# Patient Record
Sex: Female | Born: 1975 | Race: White | Hispanic: No | Marital: Married | Smoking: Never smoker
Health system: Southern US, Community
[De-identification: ages and names within clinical notes are randomized; demographics above are authoritative.]

## PROBLEM LIST (undated history)

## (undated) DIAGNOSIS — A048 Other specified bacterial intestinal infections: Secondary | ICD-10-CM

## (undated) DIAGNOSIS — F419 Anxiety disorder, unspecified: Secondary | ICD-10-CM

## (undated) DIAGNOSIS — F329 Major depressive disorder, single episode, unspecified: Secondary | ICD-10-CM

## (undated) DIAGNOSIS — L509 Urticaria, unspecified: Secondary | ICD-10-CM

## (undated) DIAGNOSIS — K760 Fatty (change of) liver, not elsewhere classified: Secondary | ICD-10-CM

## (undated) DIAGNOSIS — K219 Gastro-esophageal reflux disease without esophagitis: Secondary | ICD-10-CM

## (undated) DIAGNOSIS — R319 Hematuria, unspecified: Secondary | ICD-10-CM

## (undated) DIAGNOSIS — T783XXA Angioneurotic edema, initial encounter: Secondary | ICD-10-CM

## (undated) DIAGNOSIS — L309 Dermatitis, unspecified: Secondary | ICD-10-CM

## (undated) HISTORY — DX: Urticaria, unspecified: L50.9

## (undated) HISTORY — DX: Hematuria, unspecified: R31.9

## (undated) HISTORY — PX: UPPER GASTROINTESTINAL ENDOSCOPY: SHX188

## (undated) HISTORY — DX: Angioneurotic edema, initial encounter: T78.3XXA

## (undated) HISTORY — DX: Fatty (change of) liver, not elsewhere classified: K76.0

## (undated) HISTORY — DX: Dermatitis, unspecified: L30.9

## (undated) HISTORY — PX: CHOLECYSTECTOMY: SHX55

## (undated) HISTORY — DX: Other specified bacterial intestinal infections: A04.8

## (undated) HISTORY — DX: Anxiety disorder, unspecified: F41.9

## (undated) HISTORY — DX: Gastro-esophageal reflux disease without esophagitis: K21.9

## (undated) HISTORY — PX: OTHER SURGICAL HISTORY: SHX169

## (undated) HISTORY — DX: Major depressive disorder, single episode, unspecified: F32.9

---

## 1999-06-04 ENCOUNTER — Other Ambulatory Visit: Admission: RE | Admit: 1999-06-04 | Discharge: 1999-06-04 | Payer: Self-pay | Admitting: Family Medicine

## 2000-09-21 ENCOUNTER — Other Ambulatory Visit: Admission: RE | Admit: 2000-09-21 | Discharge: 2000-09-21 | Payer: Self-pay | Admitting: Family Medicine

## 2001-11-19 ENCOUNTER — Encounter: Payer: Self-pay | Admitting: Obstetrics and Gynecology

## 2001-11-19 ENCOUNTER — Ambulatory Visit (HOSPITAL_COMMUNITY): Admission: RE | Admit: 2001-11-19 | Discharge: 2001-11-19 | Payer: Self-pay | Admitting: Obstetrics and Gynecology

## 2005-02-23 ENCOUNTER — Emergency Department (HOSPITAL_COMMUNITY): Admission: AD | Admit: 2005-02-23 | Discharge: 2005-02-23 | Payer: Self-pay | Admitting: Emergency Medicine

## 2005-06-22 ENCOUNTER — Emergency Department (HOSPITAL_COMMUNITY): Admission: EM | Admit: 2005-06-22 | Discharge: 2005-06-22 | Payer: Self-pay | Admitting: Family Medicine

## 2005-06-22 ENCOUNTER — Ambulatory Visit (HOSPITAL_COMMUNITY): Admission: RE | Admit: 2005-06-22 | Discharge: 2005-06-22 | Payer: Self-pay | Admitting: Family Medicine

## 2008-03-25 ENCOUNTER — Emergency Department (HOSPITAL_BASED_OUTPATIENT_CLINIC_OR_DEPARTMENT_OTHER): Admission: EM | Admit: 2008-03-25 | Discharge: 2008-03-26 | Payer: Self-pay | Admitting: Emergency Medicine

## 2008-03-25 ENCOUNTER — Ambulatory Visit: Payer: Self-pay | Admitting: Diagnostic Radiology

## 2009-09-24 ENCOUNTER — Ambulatory Visit (HOSPITAL_COMMUNITY): Admission: RE | Admit: 2009-09-24 | Discharge: 2009-09-24 | Payer: Self-pay | Admitting: Family Medicine

## 2009-09-24 ENCOUNTER — Encounter (INDEPENDENT_AMBULATORY_CARE_PROVIDER_SITE_OTHER): Payer: Self-pay | Admitting: *Deleted

## 2009-09-27 ENCOUNTER — Encounter (INDEPENDENT_AMBULATORY_CARE_PROVIDER_SITE_OTHER): Payer: Self-pay | Admitting: *Deleted

## 2009-10-10 ENCOUNTER — Encounter: Payer: Self-pay | Admitting: Physician Assistant

## 2009-10-10 ENCOUNTER — Ambulatory Visit: Payer: Self-pay | Admitting: Internal Medicine

## 2009-10-10 DIAGNOSIS — R112 Nausea with vomiting, unspecified: Secondary | ICD-10-CM | POA: Insufficient documentation

## 2009-10-10 DIAGNOSIS — R1012 Left upper quadrant pain: Secondary | ICD-10-CM | POA: Insufficient documentation

## 2009-10-10 DIAGNOSIS — K219 Gastro-esophageal reflux disease without esophagitis: Secondary | ICD-10-CM | POA: Insufficient documentation

## 2009-10-10 DIAGNOSIS — R110 Nausea: Secondary | ICD-10-CM | POA: Insufficient documentation

## 2009-10-18 ENCOUNTER — Telehealth: Payer: Self-pay | Admitting: Physician Assistant

## 2009-10-19 ENCOUNTER — Ambulatory Visit: Payer: Self-pay | Admitting: Internal Medicine

## 2009-12-07 ENCOUNTER — Telehealth: Payer: Self-pay | Admitting: Internal Medicine

## 2010-03-19 NOTE — Progress Notes (Signed)
Summary: Questions about meds.  Phone Note Call from Patient Call back at Work Phone [phone]   Caller: Patient Call For: Mike Gip Reason for Call: Talk to Nurse Summary of Call: pt. has some questions about the Aciphex that was prescribed Initial call taken by: Karna Christmas,  October 18, 2009 11:40 AM  Follow-up for Phone Call        The pt started taking Aciphex on 10-10-09 and since Sat 10-13-09 the pt has had diarrhea and a headache.   Follow-up by: Joselyn Glassman,  October 18, 2009 1:44 PM  Additional Follow-up for Phone Call Additional follow up Details #1::        Per Amy Estserwood PA, take the Aciphex once daily 30 min prior to breakfast and call us Tues and let us know if you are still having watery loose stools.  She said she will call Tues. Additional Follow-up by: Joselyn Glassman,  October 18, 2009 1:54 PM

## 2010-03-19 NOTE — Letter (Signed)
Summary: EGD Instructions  [facility]  [address] [place], [place] [address]   Phone: [phone]  Fax: [phone]       Laura Hansen    1975/11/01    MRN: [id number]       Procedure Day /Date:10-26-09     Arrival Time:  7:30 AM     Procedure Time: 8:30 AM     Location of Procedure:                     X     [facility] ( Outpatient Registration)  PREPARATION FOR ENDOSCOPY   On 10-26-09 THE DAY OF THE PROCEDURE:  1.   No solid foods, milk or milk products are allowed after midnight the night before your procedure.  2.   Do not drink anything colored red or purple.  Avoid juices with pulp.  No orange juice.  3.  You may drink clear liquids until 4:30 AM , which is 4 hours before your procedure.                                                                                                CLEAR LIQUIDS INCLUDE: Water Jello Ice Popsicles Tea (sugar ok, no milk/cream) Powdered fruit flavored drinks Coffee (sugar ok, no milk/cream) Gatorade Juice: apple, white grape, white cranberry  Lemonade Clear bullion, consomm, broth Carbonated beverages (any kind) Strained chicken noodle soup Hard Candy   MEDICATION INSTRUCTIONS  Unless otherwise instructed, you should take regular prescription medications with a small sip of water as early as possible the morning of your procedure.        OTHER INSTRUCTIONS  You will need a responsible adult at least 35 years of age to accompany you and drive you home.   This person must remain in the waiting room during your procedure.  Wear loose fitting clothing that is easily removed.  Leave jewelry and other valuables at home.  However, you may wish to bring a book to read or an iPod/MP3 player to listen to music as you wait for your procedure to start.  Remove all body piercing jewelry and leave at home.  Total time from sign-in until discharge is approximately 2-3 hours.  You should go home directly  after your procedure and rest.  You can resume normal activities the day after your procedure.  The day of your procedure you should not:   Drive   Make legal decisions   Operate machinery   Drink alcohol   Return to work  You will receive specific instructions about eating, activities and medications before you leave.    The above instructions have been reviewed and explained to me by   _______________________    I fully understand and can verbalize these instructions _____________________________ Date _________

## 2010-03-19 NOTE — Procedures (Signed)
Summary: Instructions for procedure/[place]  Instructions for procedure/[place]   Imported By: Sherian Rein 10/12/2009 11:01:41  _____________________________________________________________________  External Attachment:    Type:   Image     Comment:   External Document

## 2010-03-19 NOTE — Assessment & Plan Note (Signed)
Summary: LUQ pain, small knot, referred by Dr. Suzanne Boron   History of Present Illness Visit Type: Initial Consult Primary GI MD: Stan Head MD [facility] Primary Provider: Marcelle Overlie, MD Requesting Provider: Marcelle Overlie, MD Chief Complaint: LUQ pain with knot  History of Present Illness:   PLEASANT 34 YO FEMALE NEW TO GI TODAY,REFERRED BY DR. Vincente Poli WITH C/O LUQ PAIN AND NAUSEA. PT ALSO SEEN BY [facility]. SHE HAS BEEN TREATED FOR GERD IN THE PAST WITH NEXIUM,BUT NOT ON CURRENTLY DUE TO COST.  SHE RELATES PROBLEMS WITH INTERMITTENT LUQ PAIN OVER THE PAST FEW YEARS,SHE HAD AN EPISODE A COUPLE WEEKS AGO LASTING FOR 3 DAYS AND DOUBLES HER OVER A TIMES. THIS OCCURED AFTER SHE HAD DONE A LOT OF LIFTING FOR A FESTIVAL,NO KNOWN INJURY. HER PAIN IS SHARP, NONRADIATING,WITH A KNOT LIKE FEELING INTHE LUQ.NO TRIGGERS THAT SHE IS AWARE OF, AND NO PATTERN RELATING TO FOOD INTAKE, EMPTY STOMACH,BM'S,POSITION ETC. SHE ALSO HAS FAIRLY CHRONIC NAUSEA,WORSE WHEN PAIN IS WORSE BUT PRESENT LOW LEVEL MOST DAYS. VOMITS SPORADICALLY. APPETITE FINE,WEIGHT STABLE.. + HEARTBURN, NO DYSPHAGIA. NO REGULAR ASA,NSAIDS.  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 RECENT LABS UNREMARKABLE,INCLUDING PREG.TEST. ABDOMINAL US NEGATIVE.   GI Review of Systems    Reports abdominal pain, acid reflux, and  nausea.     Location of  Abdominal pain: LUQ.    Denies belching, bloating, chest pain, dysphagia with liquids, dysphagia with solids, heartburn, loss of appetite, vomiting, vomiting blood, weight loss, and  weight gain.        Denies anal fissure, black tarry stools, change in bowel habit, constipation, diarrhea, diverticulosis, fecal incontinence, heme positive stool, hemorrhoids, irritable bowel syndrome, jaundice, light color stool, liver problems, rectal bleeding, and  rectal pain. Preventive Screening-Counseling & Management  Alcohol-Tobacco     Smoking  Status: never      Drug Use:  no.      Current Medications (verified): 1)  Kariva 0.15-0.02/0.01 Mg (21/5) Tabs (Desogestrel-Ethinyl Estradiol) .Marland Kitchen.. 1 By Mouth Once Daily 2)  Tenuate (Weight Loss) .Marland Kitchen.. 1 By Mouth Once Daily  Allergies (verified): 1)  ! Codeine 2)  ! Amoxicillin  Past History:  Past Medical History: GERD  Past Surgical History: Unremarkable  Family History: Reviewed history and no changes required. No FH of Colon Cancer:  Social History: Reviewed history and no changes required. 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  Alcohol Use - yes Illicit Drug Use - no Daily Caffeine Use Smoking Status:  never Drug Use:  no  Review of Systems       The patient complains of allergy/sinus and sleeping problems.  The patient denies anemia, anxiety-new, arthritis/joint pain, back pain, blood in urine, breast changes/lumps, change in vision, confusion, cough, coughing up blood, depression-new, fainting, fatigue, fever, headaches-new, hearing problems, heart murmur, heart rhythm changes, itching, menstrual pain, muscle pains/cramps, night sweats, nosebleeds, pregnancy symptoms, shortness of breath, skin rash, sore throat, swelling of feet/legs, swollen lymph glands, thirst - excessive , urination - excessive , urination changes/pain, urine leakage, vision changes, and voice change.         SEE HPI  Vital Signs:  Patient profile:   35 year old female Height:      69 inches Weight:      249.38 pounds BMI:     36.96 Pulse rate:   84 / minute Pulse rhythm:   regular BP sitting:  128 / 82  (left arm)  Vitals Entered By: Milford Cage NCMA (October 10, 2009 2:11 PM)  Physical Exam  General:  Well developed, well nourished, no acute distress. Head:  Normocephalic and atraumatic. Eyes:  PERRLA, no icterus. Lungs:  Clear throughout to auscultation. Heart:  Regular rate and rhythm; no murmurs, rubs,  or bruits. Abdomen:  OBESE, SOFT, SHE IS  TENDER ALONG THE LEFT COSTAL MARGIN FROM MID TO LATERAL LOWER RIBS-PRESSURE REPRODUCES HER PAIN,ALSO MILD EPIGASTRIC TENDERNESS, NO MASS OR HSM,BS+ Rectal:  NOT DONE Neurologic:  Alert and  oriented x4;  grossly normal neurologically. Psych:  Alert and cooperative. Normal mood and affect.   Impression & Recommendations:  Problem # 1:  ABDOMINAL PAIN, LEFT UPPER QUADRANT (ICD-789.02) Assessment Unchanged 34 YO FEMALE WITH INTERMITTENT LUQ PAIN OVER THE PAST COUPLE YEARS,GENERALLY LASTING FOR DAYS. HER EXAM IS CONSISTENT WITH MUSCULOSKELETAL ETIOLOGY/COSTOCHONDRITIS. SUSPECT AGGRAVATED BY REPETITIVE LIFTING.   REASSURANCE,LOCAL HEAT,AVOID LIFTING  OVER THE NEXT MONTH,DISCUSSED NSAIDS BUT SHE HAS NOT FELT THESE TO BE SUCCESSFUL IN THE PAST. NOT FOR WORK TO RESRICT LIFTING OVER 15 POUNDS OVER THE NEXT MONTH. Orders: ZEGD (ZEGD)  Problem # 2:  NAUSEA AND VOMITING (ICD-787.01) Assessment: Unchanged FAIRLY CHRONIC NAUSEA,OCCASIONAL VOMITING. ETIOLOGY NOT CLEAR,MAY BE REFLUX RELATED,NONULCER DYSPEPSIA,H.PYLORI GASTRITIS.   TRIAL OF ACIPHEX 20 MG TWICE DAILY X ONE MONTH THEN DAILY IN AM BEFORE EATING SCHEDULE FOR EGD WITH DR. Leone Payor. PROCEDURE,RISKS,ALTERNATIVES DISCUSSED IN DETAIL WITH THE PT.  Problem # 3:  GERD (ICD-530.81) Assessment: Comment Only SEE ABOVE Orders: ZEGD (ZEGD)  Patient Instructions: 1)  We sent a prescription  for Aciphex to [facility]. 2)  We have given you samples to take 1 tab 30 min prior to breakfast and 30 min prior to dinner.   3)  We scheduled the Endoscopy with Dr. Leone Payor at [facility]. Go to admitting 1st floor. 4)  Directions and brochure provided. 5)  We have given you a letter to give to your employer to advise them you should not lift over 15 pounds for the next month. 6)  Copy sent to : Clarisa Schools, MD 7)                         Dr. Rudi Heap 8)  The medication list was reviewed and reconciled.  All changed / newly prescribed  medications were explained.  A complete medication list was provided to the patient / caregiver. Prescriptions: ACIPHEX 20 MG TBEC (RABEPRAZOLE SODIUM) Take 1 tab 30 min before breakfast  #30 x 3   Entered by:   Lowry Ram NCMA   Authorized by:   Sammuel Cooper PA-c   Signed by:   Lowry Ram NCMA on 10/10/2009   Method used:   Electronically to        [facility]  [address] (retail)       [address]       [place], [place]  [address]       Ph: [phone]       Fax: [phone]   RxID:   [id number]

## 2010-03-19 NOTE — Miscellaneous (Signed)
Summary: Ranitidine and naproxen Rx  Clinical Lists Changes  Medications: Removed medication of ACIPHEX 20 MG TBEC (RABEPRAZOLE SODIUM) Take 1 tab 30 min before breakfast Removed medication of NEXIUM 40 MG CPDR (ESOMEPRAZOLE MAGNESIUM) Added new medication of RANITIDINE HCL 300 MG  TABS (RANITIDINE HCL) 1 by mouth two times a day - Signed Added new medication of NAPROXEN 500 MG TABS (NAPROXEN) 1 by mouth two times a day x 1 month - Signed Rx of RANITIDINE HCL 300 MG  TABS (RANITIDINE HCL) 1 by mouth two times a day;  #60 x 2;  Signed;  Entered by: Iva Boop MD, Clementeen Graham;  Authorized by: Iva Boop MD, [facility];  Method used: Electronically to [facility] [address], [place], [place], [place]  [address], Ph: [phone], Fax: [phone] Rx of NAPROXEN 500 MG TABS (NAPROXEN) 1 by mouth two times a day x 1 month;  #60 x 0;  Signed;  Entered by: Iva Boop MD, Clementeen Graham;  Authorized by: Iva Boop MD, [facility];  Method used: Electronically to [facility] [address], [place], [place], [place]  [address], Ph: [phone], Fax: [phone]    Prescriptions: NAPROXEN 500 MG TABS (NAPROXEN) 1 by mouth two times a day x 1 month  #60 x 0   Entered and Authorized by:   Iva Boop MD, [facility]   Signed by:   Iva Boop MD, FACG on 10/19/2009   Method used:   Electronically to        [facility]  [address] [address] (retail)       [address]       [place], [place]  [address]       Ph: [phone]       Fax: [phone]   RxID:   [id number] RANITIDINE HCL 300 MG  TABS (RANITIDINE HCL) 1 by mouth two times a day  #60 x 2   Entered and Authorized by:   Iva Boop MD, [facility]   Signed by:   Iva Boop MD, FACG on 10/19/2009   Method used:   Electronically to        [facility]  [address] (retail)       [address]       [place], [place]  [address]       Ph: [phone]       Fax: [phone]   RxID:   [id number]

## 2010-03-19 NOTE — Procedures (Signed)
Summary: Upper Endoscopy  Patient: Laura Hansen Note: All result statuses are Final unless otherwise noted.  Tests: (1) Upper Endoscopy (EGD)   EGD Upper Endoscopy       DONE     [facility]     [address]     [place], [place]  [address]           ENDOSCOPY PROCEDURE REPORT           PATIENT:  Laura, Hansen  MR#:  [id number]     BIRTHDATE:  [birth year], 34 yrs. old  GENDER:  female           ENDOSCOPIST:  Iva Boop, MD, [facility]           PROCEDURE DATE:  10/19/2009     PROCEDURE:  EGD, diagnostic     ASA CLASS:  Class I     INDICATIONS:  abdominal pain, left upper quadrant           MEDICATIONS:   Fentanyl 50 mcg IV, Versed 5 mg IV     TOPICAL ANESTHETIC:  Exactacain Spray           DESCRIPTION OF PROCEDURE:   After the risks benefits and     alternatives of the procedure were thoroughly explained, informed     consent was obtained.  The LB [id number] endoscope was     introduced through the mouth and advanced to the second portion of     the duodenum, without limitations.  The instrument was slowly     withdrawn as the mucosa was fully examined.     <<PROCEDUREIMAGES>>           A hiatal hernia was found. It was 2 cm in size.  Otherwise the     examination was normal.    Retroflexed views revealed a hiatal     hernia.    The scope was then withdrawn from the patient and the     procedure completed.           COMPLICATIONS:  None           ENDOSCOPIC IMPRESSION:     1) 2 cm hiatal hernia     2) Otherwise normal examination           3) Problems seem to be a combination of musculoskeletal pain and     some heartburn     RECOMMENDATIONS:     1) She appropriately stopped Aciphex as it caused diarrhea.     2) Ranitidine 300 mg 2 times a day (Rx sent)     3) Naproxen 500 mg 2 times a day for 1 month (Rx sent)     4)Heat to left lower ribs and left upper abdomen wall as needed                 REPEAT EXAM:  In for as needed.           Iva Boop, MD, Clementeen Graham           CC:  Marcelle Overlie, MD     The Patient           n.     eSIGNED:   Iva Boop at 10/19/2009 11:06 AM           Guadalupe Dawn, [id number]  Note: An exclamation mark (!) indicates a result that was not dispersed into the flowsheet. Document Creation Date: 10/19/2009 11:07 AM _______________________________________________________________________  (  1) Order result status: Final Collection or observation date-time: 10/19/2009 10:51 Requested date-time:  Receipt date-time:  Reported date-time:  Referring Physician:   Ordering Physician: Stan Head [phone]) Specimen Source:  Source: Launa Grill Order Number: [id number] Lab site:   Appended Document: Upper Endoscopy    Clinical Lists Changes

## 2010-03-19 NOTE — Progress Notes (Signed)
Summary: scar tissue  Phone Note Call from Patient Call back at Home Phone [phone]   Caller: Patient Call For: Dr. Leone Payor Reason for Call: Talk to Nurse Summary of Call: pt has "aggrevated" scar tissue on her abd again and want to know what she needs to do Initial call taken by: Vallarie Mare,  December 07, 2009 10:30 AM  Follow-up for Phone Call        Patient  advised to try heating pack to left side and naproxen as ordered by Dr Leone Payor at the time of her endoscopy. Patient reports she was recently diagnosed with bronchitis and coughing has flared her  left sided pain again.    I have asked her to follow up with her primary care for continued muscle stain and left sided pain.   Follow-up by: Darcey Nora RN, CGRN,  December 07, 2009 10:56 AM  Additional Follow-up for Phone Call Additional follow up Details #1::        agree Additional Follow-up by: Iva Boop MD, Clementeen Graham,  December 07, 2009 4:18 PM

## 2010-03-19 NOTE — Letter (Signed)
Summary: Out of Work  [facility]  [address] [place], [place] [address]   Phone: [phone]  Fax: [phone]    October 10, 2009   Employee:  Alcide Evener    To Whom It May Concern:   For Medical reasons, we do not want the above named patient to lift over 15 pounds for the next month.    If you need additional information, please feel free to contact our office.         Sincerely,

## 2010-03-19 NOTE — Letter (Signed)
Summary: New Patient letter  [facility]  [address] [place], [place] [address]   Phone: [phone]  Fax: [phone]       09/27/2009 MRN: [id number]  Laura Hansen [address] [place], [place]  [address]  Dear Laura Hansen,  Welcome to the Gastroenterology Division at [facility].    You are scheduled to see Dr.  Christella Hartigan on 11-07-09 at 10:30a.m. on the 3rd floor at [facility], [address]  We ask that you try to arrive at our office 15 minutes prior to your appointment time to allow for check-in.  We would like you to complete the enclosed self-administered evaluation form prior to your visit and bring it with you on the day of your appointment.  We will review it with you.  Also, please bring a complete list of all your medications or, if you prefer, bring the medication bottles and we will list them.  Please bring your insurance card so that we may make a copy of it.  If your insurance requires a referral to see a specialist, please bring your referral form from your primary care physician.  Co-payments are due at the time of your visit and may be paid by cash, check or credit card.     Your office visit will consist of a consult with your physician (includes a physical exam), any laboratory testing he/she may order, scheduling of any necessary diagnostic testing (e.g. x-ray, ultrasound, CT-scan), and scheduling of a procedure (e.g. Endoscopy, Colonoscopy) if required.  Please allow enough time on your schedule to allow for any/all of these possibilities.    If you cannot keep your appointment, please call [phone] to cancel or reschedule prior to your appointment date.  This allows [place] the opportunity to schedule an appointment for another patient in need of care.  If you do not cancel or reschedule by 5 p.m. the business day prior to your appointment date, you will be charged a $50.00 late cancellation/no-show fee.    Thank you for choosing [facility] for your medical needs.  We appreciate the opportunity to care for you.  Please visit [place] at our website  to learn more about our practice.                     Sincerely,                                                             The Gastroenterology Division

## 2010-06-04 LAB — CBC
Hemoglobin: 13.5 g/dL (ref 12.0–15.0)
MCHC: 34.4 g/dL (ref 30.0–36.0)
Platelets: 246 10*3/uL (ref 150–400)
RBC: 4.42 MIL/uL (ref 3.87–5.11)
RDW: 12.8 % (ref 11.5–15.5)
WBC: 16.5 10*3/uL — ABNORMAL HIGH (ref 4.0–10.5)

## 2010-06-04 LAB — BASIC METABOLIC PANEL
BUN: 17 mg/dL (ref 6–23)
Calcium: 9.1 mg/dL (ref 8.4–10.5)
GFR calc non Af Amer: >60 mL/min (ref >60)
Sodium: 139 mEq/L (ref 135–145)

## 2010-06-04 LAB — DIFFERENTIAL
Basophils Absolute: 0 10*3/uL (ref 0.0–0.1)
Basophils Relative: 0 % (ref 0–1)
Eosinophils Absolute: 0.6 10*3/uL (ref 0.0–0.7)
Eosinophils Relative: 4 % (ref 0–5)
Neutro Abs: 11.8 10*3/uL — ABNORMAL HIGH (ref 1.7–7.7)
Neutrophils Relative %: 71 % (ref 43–77)

## 2011-07-18 ENCOUNTER — Ambulatory Visit (HOSPITAL_COMMUNITY)
Admission: RE | Admit: 2011-07-18 | Discharge: 2011-07-18 | Disposition: A | Discharge status: Home / Self Care | Payer: BC Managed Care – PPO | Source: Ambulatory Visit | Attending: Family Medicine | Admitting: Family Medicine

## 2011-07-18 ENCOUNTER — Other Ambulatory Visit: Payer: Self-pay | Admitting: Family Medicine

## 2011-07-18 DIAGNOSIS — R1011 Right upper quadrant pain: Secondary | ICD-10-CM

## 2011-07-18 DIAGNOSIS — K769 Liver disease, unspecified: Secondary | ICD-10-CM | POA: Insufficient documentation

## 2011-07-25 ENCOUNTER — Encounter: Payer: Self-pay | Admitting: Gastroenterology

## 2011-08-01 ENCOUNTER — Encounter: Payer: Self-pay | Admitting: *Deleted

## 2011-08-08 ENCOUNTER — Ambulatory Visit: Payer: BC Managed Care – PPO | Admitting: Gastroenterology

## 2011-08-12 ENCOUNTER — Encounter: Payer: Self-pay | Admitting: Physician Assistant

## 2011-08-12 ENCOUNTER — Ambulatory Visit (INDEPENDENT_AMBULATORY_CARE_PROVIDER_SITE_OTHER): Payer: BC Managed Care – PPO | Admitting: Physician Assistant

## 2011-08-12 ENCOUNTER — Other Ambulatory Visit (INDEPENDENT_AMBULATORY_CARE_PROVIDER_SITE_OTHER): Payer: BC Managed Care – PPO

## 2011-08-12 VITALS — BP 118/74 | HR 68 | Ht 69.0 in | Wt 272.0 lb

## 2011-08-12 DIAGNOSIS — R110 Nausea: Secondary | ICD-10-CM

## 2011-08-12 DIAGNOSIS — R197 Diarrhea, unspecified: Secondary | ICD-10-CM

## 2011-08-12 DIAGNOSIS — R109 Unspecified abdominal pain: Secondary | ICD-10-CM

## 2011-08-12 LAB — CBC WITH DIFFERENTIAL/PLATELET
MCHC: 33.5 g/dL (ref 30.0–36.0)
MCV: 90.5 fl (ref 78.0–100.0)
Neutro Abs: 9.3 10*3/uL — ABNORMAL HIGH (ref 1.4–7.7)
Neutrophils Relative %: 70.5 % (ref 43.0–77.0)
RBC: 4.09 Mil/uL (ref 3.87–5.11)
RDW: 13 % (ref 11.5–14.6)
WBC: 13.2 10*3/uL — ABNORMAL HIGH (ref 4.5–10.5)

## 2011-08-12 MED ORDER — GLYCOPYRROLATE 2 MG PO TABS: 2.0000 mg | ORAL_TABLET | Freq: Two times a day (BID) | ORAL | Status: DC | PRN

## 2011-08-12 MED ORDER — METRONIDAZOLE 250 MG PO TABS: 250.0000 mg | ORAL_TABLET | Freq: Four times a day (QID) | ORAL | Status: AC

## 2011-08-12 NOTE — Progress Notes (Signed)
Subjective:    Patient ID: Laura Hansen, female    DOB: Nov 29, 1975, 36 y.o.   MRN: [id number]  HPI Laura Hansen is a 36 year old white female known to Dr. Leone Payor who had undergone upper endoscopy in 2011 for complaints of abdominal pain and left upper quadrant pain, she was found to have a small hiatal hernia. She is not on any regular PPI therapy and says she doesn't usually have heartburn symptoms.  She comes in today with new complaint of diarrhea, with initial onset about 6 weeks ago. At that time she says there was a stomach bug going around in her workplace. She developed abrupt onset of watery diarrhea associated with bad crampy abdominal pain and was having multiple of bowel movements per day. She says she did have some low-grade fevers associated at that time and nausea. Those symptoms lasted about 2 weeks She was then seen by her primary care provider and had lab work done and stool cultures including a stool for C. difficile toxin all of which were negative. She was not treated with any medications at that time. She says since over the past 3-4 weeks she is continued with loose stools and is generally having 4-5 loose bowel movements per day still associated with urgency and cramping. She says primarily she feels pain in her left upper quadrant which can be sharp at times sometimes associated with sweats after eating and then followed by diarrhea. Not noted any melena or hematochezia. She says she usually gets the left upper quadrant abdominal pain within 15-20 minutes of eating area she has had some ongoing nausea, no vomiting, no fever or chills. Her appetite has been okay but she does seem to have some early satiety symptoms. She complains of feeling bloated ,gassy and has  "gurgling" in her abdomen. She haslost 10-12 pounds in onset of her illness and has stabilized since  She did take a course of antibiotics in either March or April for sinus infection, she has not been on any new  medications or supplements or had any known infectious exposures. She does have well water at home. No family members have been ill F amily history is negative for celiac disease and inflammatory bowel disease.    Review of Systems  Constitutional: Positive for appetite change and unexpected weight change.  HENT: Negative.   Eyes: Negative.   Respiratory: Negative.   Cardiovascular: Negative.   Gastrointestinal: Positive for nausea, abdominal pain and diarrhea.  Genitourinary: Negative.   Musculoskeletal: Negative.   Neurological: Negative.   Hematological: Negative.   Psychiatric/Behavioral: Negative.    Outpatient Encounter Prescriptions as of 08/12/2011  Medication Sig Dispense Refill  . cholecalciferol (VITAMIN D) 1000 UNITS tablet Take 1,000 Units by mouth daily.      Marland Kitchen desogestrel-ethinyl estradiol (KARIVA) 0.15-0.02/0.01 MG (21/5) tablet Take 1 tablet by mouth daily.      Marland Kitchen glycopyrrolate (ROBINUL) 2 MG tablet Take 1 tablet (2 mg total) by mouth 2 (two) times daily as needed.  180 tablet  1  . metroNIDAZOLE (FLAGYL) 250 MG tablet Take 1 tablet (250 mg total) by mouth 4 (four) times daily.  30 tablet  0        Allergies  Allergen Reactions  . Amoxicillin   . Codeine    Patient Active Problem List  Diagnosis  . GERD  . NAUSEA AND VOMITING  . ABDOMINAL PAIN, LEFT UPPER QUADRANT   History   Social History  . Marital Status: Married    Spouse Name:  N/A    Number of Children: N/A  . Years of Education: N/A   Occupational History  . Not on file.   Social History Main Topics  . Smoking status: Never Smoker   . Smokeless tobacco: Not on file  . Alcohol Use: Yes     SOCIAL   . Drug Use: No  . Sexually Active: Not on file   Other Topics Concern  . Not on file   Social History Narrative  . No narrative on file    Objective:   Physical Exam well-developed white female in no acute distress, pleasant blood pressure 118/74 pulse 68 height 5 foot 9 weight 272.  HEENT; nontraumatic normocephalic EOMI PERRLA sclera anicteric, Neck; supple no JVD, Cardiovascular; regular rate and rhythm with S1-S2 no murmur or gallop, Pulmonary; clear bilaterally, Abdomen; soft, tender left upper quadrant and left mid quadrant there is no guarding, no rebound, no palpable mass or hepatosplenomegaly, Rectal; not done, Extremities; no clubbing, cyanosis, or edema skin warm and dry, Psych; mood and affect normal and appropriate.        Assessment & Plan:  #93 36 year old female with a 6 week history of illness with diarrhea and abdominal cramping initially with multiple bowel movements per day of a watery nonbloody stool, now with persistent abdominal cramping, left upper quadrant discomfort and 4-5 loose stools per day as well as persistent nausea. 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 Cannot rule out Giardia or C. Diff, also consider IBD though doubt.  Plan; CBC with differential and CRP Stool for lactoferrin and stool C. difficile PCR Start empiric Flagyl 250 mg by mouth 4 times daily x10 days Start Robinul Forte 2 mg 1 by mouth twice daily as needed for cramping and urgency Patient is very concerned ; she says she has had similar episodes in the past- last about a year ago and is worried about underlying colitis etc. given prolonged current course. Will schedule for colonoscopy with Dr. Leone Payor. Procedure discussed in detail with the patient and she is agreeable to proceed, will give her the empiric course of Flagyl and Robinul Forte, and schedule procedures 2-3 weeks from now.  She has been taking Culturelle  at  home and will continue

## 2011-08-12 NOTE — Patient Instructions (Addendum)
Please go to the basement upon leaving today to have your labs done. You have been scheduled for a Colonoscopy with separate instructions given. Your prep kit has been sent to your pharmacy for you to pick up. Your prescription(s) has(have) been sent to your pharmacy for you to pick up. Please start on a bland low roughage diet.

## 2011-08-13 ENCOUNTER — Encounter: Payer: Self-pay | Admitting: Internal Medicine

## 2011-08-13 ENCOUNTER — Other Ambulatory Visit: Payer: BC Managed Care – PPO

## 2011-08-13 DIAGNOSIS — R197 Diarrhea, unspecified: Secondary | ICD-10-CM

## 2011-08-13 DIAGNOSIS — R109 Unspecified abdominal pain: Secondary | ICD-10-CM

## 2011-08-13 DIAGNOSIS — R110 Nausea: Secondary | ICD-10-CM

## 2011-08-13 NOTE — Progress Notes (Signed)
I agree with excellent assessment and plan

## 2011-08-14 LAB — FECAL LACTOFERRIN, QUANT: Lactoferrin: NEGATIVE

## 2011-08-15 LAB — CLOSTRIDIUM DIFFICILE BY PCR: Toxigenic C. Difficile by PCR: NOT DETECTED

## 2011-08-19 ENCOUNTER — Other Ambulatory Visit: Payer: Self-pay | Admitting: *Deleted

## 2011-08-19 MED ORDER — MOVIPREP 100 G PO SOLR: 1.0000 | Freq: Once | ORAL | Status: DC

## 2011-08-29 ENCOUNTER — Encounter: Payer: Self-pay | Admitting: Internal Medicine

## 2011-08-29 ENCOUNTER — Ambulatory Visit (AMBULATORY_SURGERY_CENTER): Payer: BC Managed Care – PPO | Admitting: Internal Medicine

## 2011-08-29 VITALS — BP 122/41 | HR 80 | Temp 97.5°F | Resp 16 | Ht 69.0 in | Wt 272.0 lb

## 2011-08-29 DIAGNOSIS — D126 Benign neoplasm of colon, unspecified: Secondary | ICD-10-CM

## 2011-08-29 DIAGNOSIS — R197 Diarrhea, unspecified: Secondary | ICD-10-CM

## 2011-08-29 MED ORDER — SODIUM CHLORIDE 0.9 % IV SOLN: 500.0000 mL | INTRAVENOUS | Status: DC

## 2011-08-29 NOTE — Patient Instructions (Addendum)
There was one small polyp removed. It looks benign and I doubt it is related to your symptoms.  I suspect IBS or Irritable Bowel Syndrome but took biopsies to see if another problem is causing thins.  My office will call you with biopsy results and plans.  Thank you for choosing me and [facility].  Iva Boop, MD, FACG YOU HAD AN ENDOSCOPIC PROCEDURE TODAY AT THE [facility]: Refer to the procedure report that was given to you for any specific questions about what was found during the examination.  If the procedure report does not answer your questions, please call your gastroenterologist to clarify.  If you requested that your care partner not be given the details of your procedure findings, then the procedure report has been included in a sealed envelope for you to review at your convenience later.  YOU SHOULD EXPECT: Some feelings of bloating in the abdomen. Passage of more gas than usual.  Walking can help get rid of the air that was put into your GI tract during the procedure and reduce the bloating. If you had a lower endoscopy (such as a colonoscopy or flexible sigmoidoscopy) you may notice spotting of blood in your stool or on the toilet paper. If you underwent a bowel prep for your procedure, then you may not have a normal bowel movement for a few days.  DIET: Your first meal following the procedure should be a light meal and then it is ok to progress to your normal diet.  A half-sandwich or bowl of soup is an example of a good first meal.  Heavy or fried foods are harder to digest and may make you feel nauseous or bloated.  Likewise meals heavy in dairy and vegetables can cause extra gas to form and this can also increase the bloating.  Drink plenty of fluids but you should avoid alcoholic beverages for 24 hours.  ACTIVITY: Your care partner should take you home directly after the procedure.  You should plan to take it easy, moving slowly for the rest of the  day.  You can resume normal activity the day after the procedure however you should NOT DRIVE or use heavy machinery for 24 hours (because of the sedation medicines used during the test).    SYMPTOMS TO REPORT IMMEDIATELY: A gastroenterologist can be reached at any hour.  During normal business hours, 8:30 AM to 5:00 PM Monday through Friday, call [phone].  After hours and on weekends, please call the GI answering service at [phone] who will take a message and have the physician on call contact you.   Following lower endoscopy (colonoscopy or flexible sigmoidoscopy):  Excessive amounts of blood in the stool  Significant tenderness or worsening of abdominal pains  Swelling of the abdomen that is new, acute  Fever of 100F or higher  FOLLOW UP: If any biopsies were taken you will be contacted by phone or by letter within the next 1-3 weeks.  Call your gastroenterologist if you have not heard about the biopsies in 3 weeks.  Our staff will call the home number listed on your records the next business day following your procedure to check on you and address any questions or concerns that you may have at that time regarding the information given to you following your procedure. This is a courtesy call and so if there is no answer at the home number and we have not heard from you through the emergency physician on call, we  will assume that you have returned to your regular daily activities without incident.  SIGNATURES/CONFIDENTIALITY: You and/or your care partner have signed paperwork which will be entered into your electronic medical record.  These signatures attest to the fact that that the information above on your After Visit Summary has been reviewed and is understood.  Full responsibility of the confidentiality of this discharge information lies with you and/or your care-partner.

## 2011-08-29 NOTE — Progress Notes (Signed)
Patient did not have preoperative order for IV antibiotic SSI prophylaxis. (G8918)  Patient did not experience any of the following events: a burn prior to discharge; a fall within the facility; wrong site/side/patient/procedure/implant event; or a hospital transfer or hospital admission upon discharge from the facility. (G8907)  

## 2011-08-29 NOTE — Op Note (Signed)
[facility] [address] [place], [place]  [address]  COLONOSCOPY PROCEDURE REPORT  PATIENT:  Laura Hansen, Laura Hansen  MR#:  [id number] BIRTHDATE:  July 20, 1975, 36 yrs. old  GENDER:  female ENDOSCOPIST:  Iva Boop, MD, [facility]  PROCEDURE DATE:  08/29/2011 PROCEDURE:  Colonoscopy with biopsy and snare polypectomy ASA CLASS:  Class III INDICATIONS:  unexplained diarrhea MEDICATIONS:   These medications were titrated to patient response per physician's verbal order, MAC sedation, administered by CRNA, propofol (Diprivan) 450 mg IV  DESCRIPTION OF PROCEDURE:   After the risks benefits and alternatives of the procedure were thoroughly explained, informed consent was obtained.  Digital rectal exam was performed and revealed no abnormalities.   The LB [id number]-[id number] endoscope was introduced through the anus and advanced to the terminal ileum which was intubated for a short distance, without limitations. The quality of the prep was excellent, using MoviPrep.  The instrument was then slowly withdrawn as the colon was fully examined. <<PROCEDUREIMAGES>>  FINDINGS:  The terminal ileum appeared normal.  A diminutive polyp was found in the distal transverse colon. Polyp was snared without cautery. Retrieval was successful. snare polyp  This was otherwise a normal examination of the colon. Random biopsies were obtained and sent to pathology.   Retroflexed views in the rectum revealed no abnormalities.    The time to cecum = 2:19 minutes. The scope was then withdrawn in 10:33 minutes from the cecum and the procedure completed. COMPLICATIONS:  None ENDOSCOPIC IMPRESSION: 1) Normal terminal ileum 2) Diminutive polyp in the distal transverse colon 3) Otherwise normal examination - random colon biopsies taken RECOMMENDATIONS: 1) Await pathology results 2) will call with results and plans - suspect IBS  Iva Boop, MD, [facility]  CC:  The Patient and Rudi Heap,  MD  n. Rosalie Doctor:   Iva Boop at 08/29/2011 03:13 PM  Guadalupe Dawn, [phone]

## 2011-09-01 ENCOUNTER — Telehealth: Payer: Self-pay | Admitting: *Deleted

## 2011-09-01 NOTE — Telephone Encounter (Signed)
  Follow up Call-  Call back number 08/29/2011  Post procedure Call Back phone  # [phone]  Permission to leave phone message Yes     Patient questions:  Do you have a fever, pain , or abdominal swelling? no Pain Score  0 *  Have you tolerated food without any problems? yes  Have you been able to return to your normal activities? yes  Do you have any questions about your discharge instructions: Diet   no Medications  no Follow up visit  no  Do you have questions or concerns about your Care? no  Actions: * If pain score is 4 or above: No action needed, pain <4.

## 2011-09-05 NOTE — Progress Notes (Signed)
Quick Note:  Have her schedule a non-urgent follow-up appointment and keep the food diary   ______

## 2012-10-19 ENCOUNTER — Other Ambulatory Visit: Payer: Self-pay | Admitting: Obstetrics and Gynecology

## 2013-01-19 ENCOUNTER — Encounter: Payer: Self-pay | Admitting: Nurse Practitioner

## 2013-01-19 ENCOUNTER — Ambulatory Visit (INDEPENDENT_AMBULATORY_CARE_PROVIDER_SITE_OTHER): Payer: BC Managed Care – PPO | Admitting: Nurse Practitioner

## 2013-01-19 ENCOUNTER — Telehealth: Payer: Self-pay | Admitting: Nurse Practitioner

## 2013-01-19 ENCOUNTER — Encounter (INDEPENDENT_AMBULATORY_CARE_PROVIDER_SITE_OTHER): Payer: Self-pay

## 2013-01-19 VITALS — BP 129/84 | HR 76 | Temp 98.2°F | Ht 70.0 in | Wt 291.0 lb

## 2013-01-19 DIAGNOSIS — J209 Acute bronchitis, unspecified: Secondary | ICD-10-CM

## 2013-01-19 MED ORDER — CEFDINIR 300 MG PO CAPS: 300.0000 mg | ORAL_CAPSULE | Freq: Two times a day (BID) | ORAL | Status: DC

## 2013-01-19 MED ORDER — BENZONATATE 100 MG PO CAPS
100.0000 mg | ORAL_CAPSULE | Freq: Two times a day (BID) | ORAL | Status: DC | PRN
Start: 2013-01-19 — End: 2013-02-01

## 2013-01-19 MED ORDER — METHYLPREDNISOLONE ACETATE 80 MG/ML IJ SUSP
80.0000 mg | Freq: Once | INTRAMUSCULAR | Status: AC
  Administered 2013-01-19: 80 mg via INTRAMUSCULAR

## 2013-01-19 NOTE — Progress Notes (Signed)
   Subjective:    Patient ID: Laura Hansen, female    DOB: 1975-10-25, 37 y.o.   MRN: [id number]  HPI Patient in c/o tight productive cough keeping her up at night- low grade fever- OTC meds no help.    Review of Systems  Constitutional: Positive for fever. Negative for chills and appetite change.  HENT: Positive for congestion, rhinorrhea and sinus pressure.   Respiratory: Positive for cough (productive).   Cardiovascular: Negative.   Gastrointestinal: Negative.   Endocrine: Negative.   Genitourinary: Negative.   Musculoskeletal: Negative.   Neurological: Negative.   Hematological: Negative.   All other systems reviewed and are negative.       Objective:   Physical Exam  Constitutional: She is oriented to person, place, and time. She appears well-developed and well-nourished.  HENT:  Nose: Mucosal edema and rhinorrhea present. Right sinus exhibits no maxillary sinus tenderness and no frontal sinus tenderness. Left sinus exhibits no maxillary sinus tenderness and no frontal sinus tenderness.  Mouth/Throat: Uvula is midline, oropharynx is clear and moist and mucous membranes are normal.  Cardiovascular: Normal rate, regular rhythm and normal heart sounds.   Pulmonary/Chest: Effort normal and breath sounds normal. She has no wheezes. She has no rales.  Deep tight cough  Abdominal: Soft.  Neurological: She is alert and oriented to person, place, and time.  Skin: Skin is warm.     BP 129/84  Pulse 76  Temp(Src) 98.2 F (36.8 C) (Oral)  Ht 5\' 10"  (1.778 m)  Wt 291 lb (131.997 kg)  BMI 41.75 kg/m2      Assessment & Plan:   1. Bronchitis, acute    Meds ordered this encounter  Medications  . cefdinir (OMNICEF) 300 MG capsule    Sig: Take 1 capsule (300 mg total) by mouth 2 (two) times daily.    Dispense:  20 capsule    Refill:  0    Order Specific Question:  Supervising Provider    Answer:  Ernestina Penna [[id number]]  . benzonatate (TESSALON) 100 MG capsule   Sig: Take 1 capsule (100 mg total) by mouth 2 (two) times daily as needed for cough.    Dispense:  20 capsule    Refill:  0    Order Specific Question:  Supervising Provider    Answer:  Ernestina Penna [[id number]]  . methylPREDNISolone acetate (DEPO-MEDROL) injection 80 mg    Sig:    First 24 Hours-Clear liquids  popsicles  Jello  gatorade  Sprite Second 24 hours-Add Full liquids ( Liquids you cant see through) Third 24 hours- Bland diet ( foods that are baked or broiled)  *avoiding fried foods and highly spiced foods* During these 3 days  Avoid milk, cheese, ice cream or any other dairy products  Avoid caffeine- REMEMBER Mt. Dew and Mello Yellow contain lots of caffeine You should eat and drink in  Frequent small volumes If no improvement in symptoms or worsen in 2-3 days should RETRUN TO OFFICE or go to ER!    Mary-Margaret Daphine Deutscher, FNP

## 2013-01-19 NOTE — Telephone Encounter (Signed)
Given an appointment for today.

## 2013-01-19 NOTE — Telephone Encounter (Signed)
Sorry- ntbs hasn't been seen here in over 9 months

## 2013-01-19 NOTE — Patient Instructions (Signed)

## 2013-01-31 ENCOUNTER — Telehealth: Payer: Self-pay | Admitting: Nurse Practitioner

## 2013-02-01 MED ORDER — BENZONATATE 100 MG PO CAPS: 100.0000 mg | ORAL_CAPSULE | Freq: Two times a day (BID) | ORAL | Status: DC | PRN

## 2013-02-01 NOTE — Telephone Encounter (Signed)
Unfortunately an antibiotic will not make a difference- the cough may last up to 2 weeks- I will call in tessalon perles for you.

## 2013-02-01 NOTE — Telephone Encounter (Signed)
Patient aware and rx sent in. 

## 2013-05-05 ENCOUNTER — Other Ambulatory Visit: Payer: Self-pay | Admitting: Obstetrics and Gynecology

## 2014-01-20 ENCOUNTER — Other Ambulatory Visit: Payer: Self-pay

## 2014-01-23 LAB — CYTOLOGY - PAP

## 2014-01-28 ENCOUNTER — Encounter: Payer: Self-pay | Admitting: Nurse Practitioner

## 2014-01-28 ENCOUNTER — Ambulatory Visit (INDEPENDENT_AMBULATORY_CARE_PROVIDER_SITE_OTHER): Payer: BC Managed Care – PPO | Admitting: Nurse Practitioner

## 2014-01-28 VITALS — BP 103/72 | HR 72 | Temp 98.1°F | Ht 70.0 in | Wt 291.0 lb

## 2014-01-28 DIAGNOSIS — H6523 Chronic serous otitis media, bilateral: Secondary | ICD-10-CM

## 2014-01-28 MED ORDER — FLUTICASONE PROPIONATE 50 MCG/ACT NA SUSP: 2.0000 | Freq: Every day | NASAL | Status: DC

## 2014-01-28 NOTE — Progress Notes (Signed)
   Subjective:    Patient ID: Laura Hansen, female    DOB: 1975/04/13, 38 y.o.   MRN: [id number]  HPI Patient in today c/o left ear pain- started yesterday morning- slight sore throat.    Review of Systems  Constitutional: Negative.  Negative for fever and chills.  HENT: Positive for ear pain. Negative for congestion and rhinorrhea.   Respiratory: Negative for cough.   Cardiovascular: Negative.   Gastrointestinal: Negative.   Genitourinary: Negative.   Neurological: Negative.   Psychiatric/Behavioral: Negative.   All other systems reviewed and are negative.      Objective:   Physical Exam  Constitutional: She is oriented to person, place, and time. She appears well-developed and well-nourished. No distress.  HENT:  Right Ear: Hearing, external ear and ear canal normal. Tympanic membrane is bulging. Tympanic membrane is not erythematous and not retracted. A middle ear effusion is present.  Left Ear: Tympanic membrane is bulging. Tympanic membrane is not erythematous and not retracted. A middle ear effusion is present.  Nose: Nose normal.  Mouth/Throat: Uvula is midline, oropharynx is clear and moist and mucous membranes are normal.  Neck: Normal range of motion. Neck supple.  Cardiovascular: Normal rate, regular rhythm and normal heart sounds.   Pulmonary/Chest: Effort normal and breath sounds normal.  Neurological: She is alert and oriented to person, place, and time.  Skin: Skin is warm and dry.  Psychiatric: She has a normal mood and affect. Her behavior is normal. Judgment and thought content normal.   BP 103/72 mmHg  Pulse 72  Temp(Src) 98.1 F (36.7 C) (Oral)  Ht 5\' 10"  (1.778 m)  Wt 291 lb (131.997 kg)  BMI 41.75 kg/m2        Assessment & Plan:   1. Bilateral chronic serous otitis media    flonase OTC 2 sprays each nostril daily Force fluids RTO prn  Mary-Margaret Hassell Done, FNP

## 2014-01-28 NOTE — Addendum Note (Signed)
Addended by: Chevis Pretty on: 01/28/2014 08:41 AM   Modules accepted: Orders

## 2014-03-27 ENCOUNTER — Telehealth: Payer: Self-pay | Admitting: Nurse Practitioner

## 2014-03-28 MED ORDER — SCOPOLAMINE 1 MG/3DAYS TD PT72: 1.0000 | MEDICATED_PATCH | TRANSDERMAL | Status: DC

## 2014-03-28 NOTE — Telephone Encounter (Signed)
Patient aware rx sent to pharmacy.  

## 2014-03-28 NOTE — Telephone Encounter (Signed)
rx sent to [facility]

## 2014-03-29 ENCOUNTER — Telehealth: Payer: Self-pay | Admitting: Nurse Practitioner

## 2014-03-30 NOTE — Telephone Encounter (Signed)
willl have to use bohnen or dramamine OTC - there is nothing else

## 2014-03-31 NOTE — Telephone Encounter (Signed)
Discussed with patient.  She will try dramamine OTC.

## 2014-07-07 ENCOUNTER — Encounter: Payer: Self-pay | Admitting: Internal Medicine

## 2014-08-24 ENCOUNTER — Other Ambulatory Visit: Payer: Self-pay

## 2014-08-25 LAB — CYTOLOGY - PAP

## 2014-12-18 ENCOUNTER — Ambulatory Visit (INDEPENDENT_AMBULATORY_CARE_PROVIDER_SITE_OTHER): Payer: 59 | Admitting: Family Medicine

## 2014-12-18 VITALS — BP 122/80 | HR 81 | Temp 98.2°F | Ht 70.0 in

## 2014-12-18 DIAGNOSIS — R232 Flushing: Secondary | ICD-10-CM | POA: Diagnosis not present

## 2014-12-18 DIAGNOSIS — T7840XA Allergy, unspecified, initial encounter: Secondary | ICD-10-CM

## 2014-12-18 DIAGNOSIS — Z91018 Allergy to other foods: Secondary | ICD-10-CM

## 2014-12-18 MED ORDER — EPINEPHRINE 0.3 MG/0.3ML IJ SOAJ: 0.3000 mg | Freq: Once | INTRAMUSCULAR | Status: DC

## 2014-12-18 MED ORDER — METHYLPREDNISOLONE ACETATE 80 MG/ML IJ SUSP
80.0000 mg | Freq: Once | INTRAMUSCULAR | Status: AC
  Administered 2014-12-18: 80 mg via INTRAMUSCULAR

## 2014-12-18 NOTE — Progress Notes (Signed)
   HPI  Patient presents today here with concerns for allergic reaction.  Patient explains thatshe's had 3 similar episodes down the last 2 weeks. They have all been attached or associated with peanuts. She is concerned that she is developing a peanut allergy.  He states that 30 minutes after eating Take today she developed facial flushing, itching throat,and some thickening. 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 She is taking 75 mg of Benadryl by mouth   PMH: Smoking status noted ROS: Per HPI  Objective: BP 122/80 mmHg  Pulse 81  Temp(Src) 98.2 F (36.8 C) (Oral)  Ht 5\' 10"  (1.778 m)  LMP 11/25/2014 (Approximate) Gen: NAD, alert, cooperative with exam HEENT: NCAT, no tongue swelling CV: RRR, good S1/S2, no murmur Resp: CTABL, no wheezes, non-labored Ext: No edema, warm Neuro: Alert and oriented, No gross deficits  Assessment and plan:  # Allergic reaction Given 80 mg of Depo-Medrol in clinic, no epi given as symptoms improving Throat tightening is improved greatly from earlier today and she agrees to seek emergency medical help should it worsen later in the evening. Refer to allergy for further eval.  Epi pen and instructions given.     Meds ordered this encounter  Medications  . EPINEPHrine 0.3 mg/0.3 mL IJ SOAJ injection    Sig: Inject 0.3 mLs (0.3 mg total) into the muscle once.    Dispense:  1 Device    Refill:  [place], MD [facility] 12/18/2014, 4:23 PM

## 2014-12-18 NOTE — Patient Instructions (Signed)
For now avoid peanuts  If your throat tightening worsens or returns later seek immediate medical attention  Be sure to see the allergist.   Anaphylactic Reaction An anaphylactic reaction is a sudden, severe allergic reaction that involves the whole body. It can be life threatening. A hospital stay is often required. People with asthma, eczema, or hay fever are slightly more likely to have an anaphylactic reaction. CAUSES  An anaphylactic reaction may be caused by anything to which you are allergic. After being exposed to the allergic substance, your immune system becomes sensitized to it. When you are exposed to that allergic substance again, an allergic reaction can occur. Common causes of an anaphylactic reaction include:  Medicines.  Foods, especially peanuts, wheat, shellfish, milk, and eggs.  Insect bites or stings.  Blood products.  Chemicals, such as dyes, latex, and contrast material used for imaging tests. SYMPTOMS  When an allergic reaction occurs, the body releases histamine and other substances. These substances cause symptoms such as tightening of the airway. Symptoms often develop within seconds or minutes of exposure. Symptoms may include:  Skin rash or hives.  Itching.  Chest tightness.  Swelling of the eyes, tongue, or lips.  Trouble breathing or swallowing.  Lightheadedness or fainting.  Anxiety or confusion.  Stomach pains, vomiting, or diarrhea.  Nasal congestion.  A fast or irregular heartbeat (palpitations). DIAGNOSIS  Diagnosis is based on your history of recent exposure to allergic substances, your symptoms, and a physical exam. Your caregiver may also perform blood or urine tests to confirm the diagnosis. TREATMENT  Epinephrine medicine is the main treatment for an anaphylactic reaction. Other medicines that may be used for treatment include antihistamines, steroids, and albuterol. In severe cases, fluids and medicine to support blood pressure  may be given through an intravenous line (IV). Even if you improve after treatment, you need to be observed to make sure your condition does not get worse. This may require a stay in the hospital. [place] a medical alert bracelet or necklace stating your allergy.  You and your family must learn how to use an anaphylaxis kit or give an epinephrine injection to temporarily treat an emergency allergic reaction. Always carry your epinephrine injection or anaphylaxis kit with you. This can be lifesaving if you have a severe reaction.  Do not drive or perform tasks after treatment until the medicines used to treat your reaction have worn off, or until your caregiver says it is okay.  If you have hives or a rash:  Take medicines as directed by your caregiver.  You may use an over-the-counter antihistamine (diphenhydramine) as needed.  Apply cold compresses to the skin or take baths in cool water. Avoid hot baths or showers. SEEK MEDICAL CARE IF:   You develop symptoms of an allergic reaction to a new substance. Symptoms may start right away or minutes later.  You develop a rash, hives, or itching.  You develop new symptoms. SEEK IMMEDIATE MEDICAL CARE IF:   You have swelling of the mouth, difficulty breathing, or wheezing.  You have a tight feeling in your chest or throat.  You develop hives, swelling, or itching all over your body.  You develop severe vomiting or diarrhea.  You feel faint or pass out. This is an emergency. Use your epinephrine injection or anaphylaxis kit as you have been instructed. Call your local emergency services (911 in [place] Even if you improve after the injection, you need to be examined at  a hospital emergency department. MAKE SURE YOU:   Understand these instructions.  Will watch your condition.  Will get help right away if you are not doing well or get worse.   This information is not intended to replace advice given to you by  your health care provider. Make sure you discuss any questions you have with your health care provider.   Document Released: 02/03/2005 Document Revised: 02/08/2013 Document Reviewed: 08/16/2014 Elsevier Interactive Patient Education [employer].

## 2015-01-01 ENCOUNTER — Telehealth: Payer: Self-pay

## 2015-01-01 DIAGNOSIS — T7840XA Allergy, unspecified, initial encounter: Secondary | ICD-10-CM

## 2015-01-01 NOTE — Telephone Encounter (Signed)
Patient said she is suppose to get a referral for allergist

## 2015-01-04 ENCOUNTER — Ambulatory Visit (INDEPENDENT_AMBULATORY_CARE_PROVIDER_SITE_OTHER): Payer: 59 | Admitting: Allergy and Immunology

## 2015-01-04 ENCOUNTER — Encounter: Payer: Self-pay | Admitting: Allergy and Immunology

## 2015-01-04 VITALS — BP 128/70 | HR 78 | Temp 98.0°F | Resp 18 | Ht 66.93 in | Wt 277.3 lb

## 2015-01-04 DIAGNOSIS — L509 Urticaria, unspecified: Secondary | ICD-10-CM

## 2015-01-04 DIAGNOSIS — J309 Allergic rhinitis, unspecified: Secondary | ICD-10-CM

## 2015-01-04 DIAGNOSIS — H1010 Acute atopic conjunctivitis, unspecified eye: Secondary | ICD-10-CM

## 2015-01-04 NOTE — Patient Instructions (Signed)
Take Home Sheet  1. Avoidance: Mite, Mold and Pollen and peanut and tree nuts.  2. Antihistamine: Claritin 10mg  by mouth once daily for runny nose or itching.  3. Nasal Spray:  Saline 2 spray(s) each nostril once daily for stuffy nose or drainage.   4.  Obtain labs at [facility]--Specific IgE for tree nut panel, peanut component, and alpha gal.  5.  Epi-pen/Benadryl as needed.  6. Follow up Visit:  2-3 months or sooner if needed.   Websites that have reliable Patient information: 1. American Academy of Asthma, Allergy, & Immunology: www.aaaai.org 2. Food Allergy Network: www.foodallergy.org 3. Mothers of Asthmatics: www.aanma.org 4. [place]: DiningCalendar.de 5. American College of Allergy, Asthma, & Immunology: https://robertson.info/ or www.acaai.org

## 2015-01-04 NOTE — Progress Notes (Signed)
NEW PATIENT NOTE  RE: Laura Hansen MRN: [id number] DOB: [birth year] [facility] [place] [id number] Date of Office Visit: 01/04/2015  Referring provider: Chevis Pretty, FNP   Subjective:  Laura Hansen is a 40 y.o. female who presents today for Allergic Reaction  Assessment:   1. Allergic rhinoconjunctivitis   2. Hives, clear skin today.   3.      Patient concern for peanut allergy with essentially negative skin prick testing today. Plan:   Patient Instructions   1. Avoidance: Mite, Mold and Pollen and peanut and tree nuts. 2. Antihistamine: Claritin 10mg  by mouth once daily for runny nose or itching. 3. Nasal Spray:  Saline 2 spray(s) each nostril once daily for stuffy nose or drainage.  4.  Obtain labs at [facility]--Specific IgE for peanut/tree nut panel, peanut components, and alpha gal. 5.  Epi-pen/Benadryl as needed. 6. Follow up Visit:  2-3 months or sooner if needed.  HPI: Laura Hansen, a 39 year old presents with a many year history of mild rhinorrhea, congestion, sneezing, itchy watery eyes and rare postnasal drip.  She denies cough, chest congestion, difficulty breathing or wheeze but describes pollen, outdoors and fluctuant weather patterns, as provoking factors to her symptoms.  She recalls few times a year, receiving antibiotics and prednisone for "sinus infections" or bronchitis.  There have been no emergency department visits, hospitalizations, nocturnal, daily or exercise induced concerns.  However, in the recent weeks she has been concerned about episodes of hives, itching and hand swelling.  She recalled ingesting a peanut butter and grape jelly sandwich about 8 PM with subsequent minimal itching and hand irritation, which improved with Benadryl and by the next morning had resolved.  She feels her symptoms began within 30 minutes of ingesting her sandwich but was not sure that  was the trigger.  She describes the next day, there were similar symptoms with a thickness of her mouth, itching and additional hand swelling.  (the morning breakfast was likely coffee, eggs and bacon).  Neither the episodes had lip/tongue/throat swelling, difficulty breathing, shortness of breath, GI  or other associated symptoms.  She took additional Benadryl and has since been avoiding peanuts and tree nuts.  Last week she describes an episode of facial/tongue tightening, generalized warmth and rare hives 20 minutes after ingesting a chocolate [employer] cupcake.  With those symptoms, she went to primary M.D. for evaluation where she received cortisone injection and Benadryl.  She has had no further difficulties and currently feels well.  She does remember a tick bite this year.  She does not recall any difficulty with beef or other specific foods and no recollection of childhood skin difficulties or food sensitivities.    Medical History: Past Medical History:  Reports history of hiatal hernia and colitis.   Diagnosis Date  . Hematuria   . Anxiety   . Depression   . Hepatic steatosis   . H. pylori infection   . GERD (gastroesophageal reflux disease)   . Eczema     as a child   Surgical History: Past Surgical History  Procedure Laterality Date  . Upper gastrointestinal endoscopy     Family History: Family History  Problem Relation Age of Onset  . Colon cancer Neg Hx   . Allergic rhinitis Neg Hx   . Asthma Neg Hx   . Eczema Neg Hx   . Urticaria Neg Hx    Social History: Social History  .  Marital Status: Married    Spouse Name: N/A  . Number of Children: N/A  . Years of Education: N/A   Social History Main Topics  . Smoking status: Never Smoker   . Smokeless tobacco: Never Used  . Alcohol Use: Yes     Comment: SOCIAL   . Drug Use: No  . 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  Medications prior to this encounter:  Has not used Claritin, Zyrtec or Allegra. Outpatient Prescriptions Prior to Visit  Medication Sig Dispense Refill  . EPINEPHrine 0.3 mg/0.3 mL IJ SOAJ injection Inject 0.3 mLs (0.3 mg total) into the muscle once. 1 Device 1  . ergocalciferol (VITAMIN D2) 50000 UNITS capsule Take 50,000 Units by mouth once a week.    . fluticasone (FLONASE) 50 MCG/ACT nasal spray Place 2 sprays into both nostrils daily. (Patient not taking: Reported on 01/04/2015) 16 g 6  . glycopyrrolate (ROBINUL) 2 MG tablet Take 1 tablet (2 mg total) by mouth 2 (two) times daily as needed. 180 tablet 1    Drug Allergies: Allergies  Allergen Reactions  . Amoxicillin  history of yeast infection   . Codeine  hallucinations and headache     Environmental History: C[place]o lives in a greater than [address] year old farm house for the last 8 months with wood floors, central air and heat, bedroom humidifier, dogs and secondary smoke exposure.  Sleeps on stuffed mattress with non-feather pillow and comforter.  Review of Systems  Constitutional: Negative for fever, weight loss and malaise/fatigue.  HENT: Positive for congestion. Negative for ear pain, hearing loss, nosebleeds and sore throat.   Eyes: Negative for discharge and redness.       Reading glasses  Respiratory: Negative for shortness of breath.        Previous history of bronchitis and one episode of pneumonia.  Gastrointestinal: Negative for heartburn, nausea, vomiting, abdominal pain, diarrhea and constipation.  Genitourinary: Negative.   Musculoskeletal: Negative for myalgias and joint pain.  Skin: Negative.  Negative for itching and rash.  Neurological: Negative.  Negative for dizziness, seizures, weakness and headaches.  Endo/Heme/Allergies: Positive for environmental allergies (denies sensitivity to latex, stinging insects, NSAIDs,  jewelry and cosmetics.).     Objective:   Filed Vitals:   01/04/15 0927  BP: 128/70  Pulse: 78  Temp: 98 F (36.7 C)  Resp: 18   Physical Exam  Constitutional: She is well-developed, well-nourished, and in no distress.  HENT:  Head: Atraumatic.  Right Ear: Tympanic membrane and ear canal normal.  Left Ear: Tympanic membrane and ear canal normal.  Nose: Mucosal edema present. No rhinorrhea. No epistaxis.  Mouth/Throat: Oropharynx is clear and moist and mucous membranes are normal. No oropharyngeal exudate, posterior oropharyngeal edema or posterior oropharyngeal erythema.  Eyes: Conjunctivae are normal.  Neck: Neck supple.  Cardiovascular: Normal rate, S1 normal and S2 normal.   No murmur heard. Pulmonary/Chest: Effort normal. She has no wheezes. She has no rhonchi. She has no rales.  Abdominal: Soft. Normal appearance and bowel sounds are normal.  Lymphadenopathy:    She has no cervical adenopathy.  Skin: Skin is warm and intact. No rash noted. No cyanosis. Nails show no clubbing.    Diagnostics: Skin testing: Stronger activity to dust mite, cat hair dog epithelial selected mold species and tree pollen mix; mild reactivity to 2 grass pollen mix,  and selected mold species and negative to selected foods.  (only equivocal reactivity to peanut and almond).    Tiffini Blacksher M. Ishmael Holter, MD  cc: Laura Pretty, FNP

## 2015-01-08 LAB — NUT MIX PROFILE
Almonds: <0.1 kU/L
Hazelnut: <0.1 kU/L
Peanut IgE: <0.1 kU/L
Pistachio  IgE: <0.1 kU/L
Walnut: <0.1 kU/L

## 2015-01-08 LAB — ALLERGEN, PEANUT COMPONENT PANEL
Ara h 1 (f422): <0.1 kU/L
Ara h 3 (f424): <0.1 kU/L
Ara h 8 (f352): <0.1 kU/L
Ara h 9 (f427: <0.1 kU/L

## 2015-01-09 LAB — ALPHA-GAL PANEL
Allergen, Mutton, f88: <0.1 kU/L
Beef: <0.1 kU/L

## 2015-01-17 ENCOUNTER — Encounter: Payer: Self-pay | Admitting: Allergy and Immunology

## 2015-01-29 ENCOUNTER — Telehealth: Payer: Self-pay

## 2015-01-29 NOTE — Telephone Encounter (Signed)
Spoke with patient--reviewed negative lab results.  Patient reports no further hives/swelling episodes since visit.  She has been avoiding peanuts diligently.   Reviewed option of repeating labs further out from her Nov. Episodes and considering in office peanut butter challenge.  Pt agreed will complete order for specific IgE for peanut/etc to be drawn in mid January and additional plans based on results and continue with avoidance for now.

## 2015-01-29 NOTE — Telephone Encounter (Signed)
Patient called, had labs drawn on 01-04-15 and has not heard anything.  She is requesting results. Advised Dr. Ishmael Holter not in office today but I would talk to her tomorrow in [place] and someone would call her then.  Please call her back at [phone].

## 2015-02-21 ENCOUNTER — Telehealth: Payer: Self-pay | Admitting: Allergy and Immunology

## 2015-02-21 NOTE — Telephone Encounter (Signed)
Pt called in stating that we were supposed to be back in touch with her about going for more bloodwork that we were to order. She said she missed a call from Dr. Ishmael Holter before the holiday pls advise

## 2015-02-23 NOTE — Telephone Encounter (Signed)
Patient informed and will follow up with labs in February.

## 2015-02-23 NOTE — Telephone Encounter (Signed)
Please inform patient Will complete lab form and mail to her home address. No urgency can complete in February.

## 2015-02-23 NOTE — Telephone Encounter (Signed)
Patient is calling to see if Dr. Ishmael Holter has responded about her blood work.   Pls Advise  Thanks

## 2015-03-21 ENCOUNTER — Other Ambulatory Visit: Payer: Self-pay | Admitting: Allergy and Immunology

## 2015-03-21 DIAGNOSIS — L509 Urticaria, unspecified: Secondary | ICD-10-CM

## 2015-03-21 NOTE — Telephone Encounter (Signed)
As is Feb 1st-- (extended time since any hives)  Phone call to patient reviewed plan and completed our lab forms --awaiting call back from Primary MD for their additional labs.  Pt agreed with plan and will have labs drawn as soon as receives forms.

## 2015-03-23 ENCOUNTER — Other Ambulatory Visit: Payer: Self-pay | Admitting: Allergy and Immunology

## 2015-03-23 DIAGNOSIS — L509 Urticaria, unspecified: Secondary | ICD-10-CM

## 2015-03-28 ENCOUNTER — Ambulatory Visit (INDEPENDENT_AMBULATORY_CARE_PROVIDER_SITE_OTHER): Payer: 59 | Admitting: Pediatrics

## 2015-03-28 ENCOUNTER — Encounter: Payer: Self-pay | Admitting: Pediatrics

## 2015-03-28 VITALS — BP 117/81 | HR 83 | Temp 97.1°F | Ht 66.93 in | Wt 285.4 lb

## 2015-03-28 DIAGNOSIS — R42 Dizziness and giddiness: Secondary | ICD-10-CM | POA: Diagnosis not present

## 2015-03-28 DIAGNOSIS — R110 Nausea: Secondary | ICD-10-CM

## 2015-03-28 DIAGNOSIS — T7840XD Allergy, unspecified, subsequent encounter: Secondary | ICD-10-CM

## 2015-03-28 DIAGNOSIS — N926 Irregular menstruation, unspecified: Secondary | ICD-10-CM

## 2015-03-28 LAB — POCT URINE PREGNANCY: PREG TEST UR: NEGATIVE

## 2015-03-28 LAB — POCT GLYCOSYLATED HEMOGLOBIN (HGB A1C): HEMOGLOBIN A1C: 5.6

## 2015-03-28 NOTE — Patient Instructions (Signed)
Epley Maneuver Self-Care  WHAT IS THE EPLEY MANEUVER?  The Epley maneuver is an exercise you can do to relieve symptoms of benign paroxysmal positional vertigo (BPPV). This condition is often just referred to as vertigo. BPPV is caused by the movement of tiny crystals (canaliths) inside your inner ear. The accumulation and movement of canaliths in your inner ear causes a sudden spinning sensation (vertigo) when you move your head to certain positions. Vertigo usually lasts about 30 seconds. BPPV usually occurs in just one ear. If you get vertigo when you lie on your left side, you probably have BPPV in your left ear. Your health care provider can tell you which ear is involved.   BPPV may be caused by a head injury. Many people older than 50 get BPPV for unknown reasons. If you have been diagnosed with BPPV, your health care provider may teach you how to do this maneuver. BPPV is not life threatening (benign) and usually goes away in time.   WHEN SHOULD I PERFORM THE EPLEY MANEUVER?  You can do this maneuver at home whenever you have symptoms of vertigo. You may do the Epley maneuver up to 3 times a day until your symptoms of vertigo go away.  HOW SHOULD I DO THE EPLEY MANEUVER?  1. Sit on the edge of a bed or table with your back straight. Your legs should be extended or hanging over the edge of the bed or table.    2. Turn your head halfway toward the affected ear.    3. Lie backward quickly with your head turned until you are lying flat on your back. You may want to position a pillow under your shoulders.    4. Hold this position for 30 seconds. You may experience an attack of vertigo. This is normal. Hold this position until the vertigo stops.  5. Then turn your head to the opposite direction until your unaffected ear is facing the floor.    6. Hold this position for 30 seconds. You may experience an attack of vertigo. This is normal. Hold this position until the vertigo stops.  7. Now turn your whole body to  the same side as your head. Hold for another 30 seconds.    8. You can then sit back up.  ARE THERE RISKS TO THIS MANEUVER?  In some cases, you may have other symptoms (such as changes in your vision, weakness, or numbness). If you have these symptoms, stop doing the maneuver and call your health care provider. Even if doing these maneuvers relieves your vertigo, you may still have dizziness. Dizziness is the sensation of light-headedness but without the sensation of movement. Even though the Epley maneuver may relieve your vertigo, it is possible that your symptoms will return within 5 years.  WHAT SHOULD I DO AFTER THIS MANEUVER?  After doing the Epley maneuver, you can return to your normal activities. Ask your doctor if there is anything you should do at home to prevent vertigo. This may include:  · Sleeping with two or more pillows to keep your head elevated.  · Not sleeping on the side of your affected ear.  · Getting up slowly from bed.  · Avoiding sudden movements during the day.  · Avoiding extreme head movement, like looking up or bending over.  · Wearing a cervical collar to prevent sudden head movements.  WHAT SHOULD I DO IF MY SYMPTOMS GET WORSE?  Call your health care provider if your vertigo gets worse. Call your provider right way if   you have other symptoms, including:   · Nausea.  · Vomiting.  · Headache.  · Weakness.  · Numbness.  · Vision changes.     This information is not intended to replace advice given to you by your health care provider. Make sure you discuss any questions you have with your health care provider.     Document Released: 02/08/2013 Document Reviewed: 02/08/2013  Elsevier Interactive Patient Education ©2016 Elsevier Inc.

## 2015-03-28 NOTE — Progress Notes (Signed)
Subjective:    Patient ID: Laura Hansen, female    DOB: [birth year], 40 y.o.   MRN: [id number]  CC: Nausea; Dizziness; and Fatigue   HPI: Laura Hansen is a 40 y.o. female presenting for Nausea; Dizziness; and Fatigue  Allergic reaction to possibly peanuts a few months ago, tongue swelling, required epi. Seen by allergy, allergy testing unequivocal.   Over past two weeks has had episodes of "fuzziness" that lasts for a few minute at a time.  Sometimes feels head spinning when she turns head or moves position.  Two weeks ago had negative pregnancy test, had a very light period two weeks ago, different from normal.   No fevers. Normal appetite. No nausea or vomiting.   Depression screen PHQ 2/9 03/28/2015  Decreased Interest 0  Down, Depressed, Hopeless 0  PHQ - 2 Score 0     Relevant past medical, surgical, family and social history reviewed and updated as indicated. Interim medical history since our last visit reviewed. Allergies and medications reviewed and updated.    ROS: Per HPI unless specifically indicated above  History  Smoking status  . Never Smoker   Smokeless tobacco  . Never Used    Past Medical History Patient Active Problem List   Diagnosis Date Noted  . Allergic reaction 12/18/2014  . GERD 10/10/2009  . NAUSEA AND VOMITING 10/10/2009  . ABDOMINAL PAIN, LEFT UPPER QUADRANT 10/10/2009    Current Outpatient Prescriptions  Medication Sig Dispense Refill  . DiphenhydrAMINE HCl (BENADRYL ALLERGY PO) Take by mouth. Reported on 03/28/2015    . EPINEPHrine 0.3 mg/0.3 mL IJ SOAJ injection Inject 0.3 mLs (0.3 mg total) into the muscle once. (Patient not taking: Reported on 03/28/2015) 1 Device 1  . fluticasone (FLONASE) 50 MCG/ACT nasal spray Place 2 sprays into both nostrils daily. (Patient not taking: Reported on 01/04/2015) 16 g 6  . glycopyrrolate (ROBINUL) 2 MG tablet Take 1 tablet (2 mg total) by mouth 2 (two) times daily as needed. 180 tablet 1    No current facility-administered medications for this visit.       Objective:    BP 117/81 mmHg  Pulse 83  Temp(Src) 97.1 F (36.2 C) (Oral)  Ht 5' 6.93" (1.7 m)  Wt 285 lb 6.4 oz (129.457 kg)  BMI 44.79 kg/m2  LMP 03/21/2015  Wt Readings from Last 3 Encounters:  03/28/15 285 lb 6.4 oz (129.457 kg)  01/04/15 277 lb 5.4 oz (125.8 kg)  01/28/14 291 lb (131.997 kg)     Gen: NAD, alert, cooperative with exam, NCAT EYES: EOMI, no scleral injection or icterus ENT:  TMs pearly gray b/l, OP without erythema LYMPH: no cervical LAD CV: NRRR, normal S1/S2, no murmur, distal pulses 2+ b/l Resp: CTABL, no wheezes, normal WOB Abd: +BS, soft, NTND. no guarding or organomegaly Ext: No edema, warm Neuro: Alert and oriented, strength equal b/l UE and LE, coordination grossly normal, several beats of nystagmus with Lward gaze, CN III-XII intact MSK: normal muscle bulk     Assessment & Plan:    Laura Hansen was seen today for nausea, dizziness and fatigue, extra light period which is unusual for her. Trying to get pregnant. Will check below labs. Pt with nystagmus, possible this is BPPV related but will make sure not anemic, sugar levels are normal. Gave handout for Epley manuevers.  Diagnoses and all orders for this visit:  Dizzy -     POCT glycosylated hemoglobin (Hb A1C) -     BMP8+EGFR -  CBC -     VITAMIN D 25 Hydroxy (Vit-D Deficiency, Fractures)  Nausea without vomiting -     POCT urine pregnancy  Missed period -     Beta HCG, Quant  Allergic reaction Pt has epipen at home, is avoiding tree nuts and peanuts, no further events going to follow up with allergy as needed.  Follow up plan: 4 weeks, sooner if needed  Assunta Found, MD [facility] 03/28/2015, 3:34 PM

## 2015-03-29 LAB — BETA HCG QUANT (REF LAB): hCG Quant: <1 m[IU]/mL

## 2015-03-29 LAB — CBC WITH DIFFERENTIAL/PLATELET
BASOS: 0 %
Basophils Absolute: 0 10*3/uL (ref 0.0–0.2)
EOS (ABSOLUTE): 0.3 10*3/uL (ref 0.0–0.4)
Eos: 2 %
HEMOGLOBIN: 13.7 g/dL (ref 11.1–15.9)
Hematocrit: 40.2 % (ref 34.0–46.6)
Immature Grans (Abs): 0 10*3/uL (ref 0.0–0.1)
Immature Granulocytes: 0 %
LYMPHS ABS: 2.6 10*3/uL (ref 0.7–3.1)
LYMPHS: 17 %
MCH: 30.9 pg (ref 26.6–33.0)
MCHC: 34.1 g/dL (ref 31.5–35.7)
MCV: 91 fL (ref 79–97)
MONOCYTES: 7 %
Monocytes Absolute: 1.1 10*3/uL — ABNORMAL HIGH (ref 0.1–0.9)
NEUTROS ABS: 10.9 10*3/uL — AB (ref 1.4–7.0)
Neutrophils: 74 %
Platelets: 315 10*3/uL (ref 150–379)
RBC: 4.43 x10E6/uL (ref 3.77–5.28)
RDW: 13 % (ref 12.3–15.4)
WBC: 15 10*3/uL — ABNORMAL HIGH (ref 3.4–10.8)

## 2015-03-29 LAB — CBC
HEMOGLOBIN: 13.4 g/dL (ref 11.1–15.9)
Hematocrit: 39.2 % (ref 34.0–46.6)
MCH: 30.7 pg (ref 26.6–33.0)
MCHC: 34.2 g/dL (ref 31.5–35.7)
MCV: 90 fL (ref 79–97)
NRBC: 0 % (ref 0–0)
PLATELETS: 302 10*3/uL (ref 150–379)
RBC: 4.37 x10E6/uL (ref 3.77–5.28)
RDW: 13.3 % (ref 12.3–15.4)
WBC: 15.2 10*3/uL — ABNORMAL HIGH (ref 3.4–10.8)

## 2015-03-29 LAB — BMP8+EGFR
BUN / CREAT RATIO: 19 (ref 8–20)
BUN: 10 mg/dL (ref 6–20)
CALCIUM: 9.3 mg/dL (ref 8.7–10.2)
CO2: 24 mmol/L (ref 18–29)
Chloride: 97 mmol/L (ref 96–106)
Creatinine, Ser: 0.52 mg/dL — ABNORMAL LOW (ref 0.57–1.00)
GFR, EST AFRICAN AMERICAN: 139 mL/min/{1.73_m2} (ref >59)
GFR, EST NON AFRICAN AMERICAN: 121 mL/min/{1.73_m2} (ref >59)
Glucose: 94 mg/dL (ref 65–99)
POTASSIUM: 3.8 mmol/L (ref 3.5–5.2)
Sodium: 137 mmol/L (ref 134–144)

## 2015-03-29 LAB — VITAMIN D 25 HYDROXY (VIT D DEFICIENCY, FRACTURES): Vit D, 25-Hydroxy: 24 ng/mL — ABNORMAL LOW (ref 30.0–100.0)

## 2015-03-29 LAB — SPECIMEN STATUS REPORT

## 2015-06-06 ENCOUNTER — Other Ambulatory Visit: Payer: Self-pay | Admitting: Obstetrics & Gynecology

## 2015-06-06 DIAGNOSIS — R928 Other abnormal and inconclusive findings on diagnostic imaging of breast: Secondary | ICD-10-CM

## 2015-06-11 ENCOUNTER — Ambulatory Visit
Admission: RE | Admit: 2015-06-11 | Discharge: 2015-06-11 | Disposition: A | Discharge status: Home / Self Care | Payer: Self-pay | Source: Ambulatory Visit | Attending: Obstetrics & Gynecology | Admitting: Obstetrics & Gynecology

## 2015-06-11 ENCOUNTER — Other Ambulatory Visit: Payer: Self-pay | Admitting: Obstetrics & Gynecology

## 2015-06-11 DIAGNOSIS — N6310 Unspecified lump in the right breast, unspecified quadrant: Secondary | ICD-10-CM

## 2015-06-11 DIAGNOSIS — R928 Other abnormal and inconclusive findings on diagnostic imaging of breast: Secondary | ICD-10-CM

## 2015-06-12 ENCOUNTER — Ambulatory Visit
Admission: RE | Admit: 2015-06-12 | Discharge: 2015-06-12 | Disposition: A | Discharge status: Home / Self Care | Payer: 59 | Source: Ambulatory Visit | Attending: Obstetrics & Gynecology | Admitting: Obstetrics & Gynecology

## 2015-06-12 ENCOUNTER — Other Ambulatory Visit: Payer: Self-pay

## 2015-06-12 DIAGNOSIS — N6310 Unspecified lump in the right breast, unspecified quadrant: Secondary | ICD-10-CM

## 2015-06-12 HISTORY — PX: BREAST BIOPSY: SHX20

## 2015-06-28 ENCOUNTER — Ambulatory Visit (INDEPENDENT_AMBULATORY_CARE_PROVIDER_SITE_OTHER): Payer: 59 | Admitting: Family

## 2015-06-28 ENCOUNTER — Encounter: Payer: Self-pay | Admitting: Family

## 2015-06-28 ENCOUNTER — Other Ambulatory Visit: Payer: Self-pay | Admitting: Family

## 2015-06-28 ENCOUNTER — Ambulatory Visit
Admission: RE | Admit: 2015-06-28 | Discharge: 2015-06-28 | Disposition: A | Discharge status: Home / Self Care | Payer: 59 | Source: Ambulatory Visit | Attending: Family | Admitting: Family

## 2015-06-28 ENCOUNTER — Telehealth: Payer: Self-pay | Admitting: Family

## 2015-06-28 VITALS — BP 132/90 | HR 71 | Temp 98.5°F | Ht 66.75 in | Wt 283.0 lb

## 2015-06-28 DIAGNOSIS — R1011 Right upper quadrant pain: Secondary | ICD-10-CM | POA: Diagnosis not present

## 2015-06-28 DIAGNOSIS — K760 Fatty (change of) liver, not elsewhere classified: Secondary | ICD-10-CM

## 2015-06-28 NOTE — Telephone Encounter (Signed)
Patient was complaining of RUQ pain and therefore a CT was ordered. Labs have not been resulted yet. We will call with those results possibly tomorrow.

## 2015-06-28 NOTE — Patient Instructions (Signed)

## 2015-06-28 NOTE — Progress Notes (Signed)
   Subjective:    Patient ID: Laura Hansen, female    DOB: [birth year], 40 y.o.   MRN: [id number]  Abdominal Pain This is a new problem. The current episode started more than 1 month ago. The onset quality is gradual. The problem occurs intermittently. The problem has been waxing and waning. The pain is located in the RUQ. The pain is at a severity of 7/10. The pain is moderate. The quality of the pain is dull and sharp. The abdominal pain radiates to the right shoulder. Associated symptoms include belching, a fever, flatus, nausea and vomiting. Pertinent negatives include no constipation, diarrhea, dysuria, hematuria or myalgias. The pain is aggravated by eating. The pain is relieved by belching. Treatments tried: avoid fatty foods. The treatment provided mild relief.      Review of Systems  Constitutional: Positive for fever.  Gastrointestinal: Positive for nausea, vomiting, abdominal pain and flatus. Negative for diarrhea and constipation.  Genitourinary: Negative for dysuria and hematuria.  Musculoskeletal: Negative for myalgias.  All other systems reviewed and are negative.      Objective:   Physical Exam  Constitutional: She is oriented to person, place, and time. She appears well-developed and well-nourished. No distress.  HENT:  Head: Normocephalic and atraumatic.  Eyes: Pupils are equal, round, and reactive to light.  Neck: Normal range of motion. Neck supple. No thyromegaly present.  Cardiovascular: Normal rate, regular rhythm, normal heart sounds and intact distal pulses.   No murmur heard. Pulmonary/Chest: Effort normal and breath sounds normal. No respiratory distress. She has no wheezes.  Abdominal: Soft. Bowel sounds are normal. She exhibits no distension. There is tenderness (RUQ pain). There is guarding.  Musculoskeletal: Normal range of motion. She exhibits no edema or tenderness.  Neurological: She is alert and oriented to person, place, and time. She has normal  reflexes. No cranial nerve deficit.  Skin: Skin is warm and dry.  Psychiatric: She has a normal mood and affect. Her behavior is normal. Judgment and thought content normal.  Vitals reviewed.     BP 132/90 mmHg  Pulse 71  Temp(Src) 98.5 F (36.9 C) (Oral)  Ht 5' 6.75" (1.695 m)  Wt 283 lb (128.368 kg)  BMI 44.68 kg/m2     Assessment & Plan:  1. RUQ abdominal pain -Do not eat or drink anything until ultrasound -:Labs pending -Avoid fatty foods -RTO prn  - CBC with Differential/Platelet - CMP14+EGFR - Amylase - Lipase - US Abdomen Limited RUQ; Future  [name], FNP

## 2015-06-29 LAB — CBC WITH DIFFERENTIAL/PLATELET
BASOS: 0 %
Basophils Absolute: 0 10*3/uL (ref 0.0–0.2)
EOS (ABSOLUTE): 0.3 10*3/uL (ref 0.0–0.4)
Eos: 2 %
HEMATOCRIT: 40.2 % (ref 34.0–46.6)
Hemoglobin: 13.4 g/dL (ref 11.1–15.9)
IMMATURE GRANULOCYTES: 0 %
Immature Grans (Abs): 0 10*3/uL (ref 0.0–0.1)
LYMPHS ABS: 2.1 10*3/uL (ref 0.7–3.1)
LYMPHS: 19 %
MCH: 29.9 pg (ref 26.6–33.0)
MCHC: 33.3 g/dL (ref 31.5–35.7)
MCV: 90 fL (ref 79–97)
MONOCYTES: 7 %
MONOS ABS: 0.7 10*3/uL (ref 0.1–0.9)
NEUTROS ABS: 8 10*3/uL — AB (ref 1.4–7.0)
Neutrophils: 72 %
Platelets: 300 10*3/uL (ref 150–379)
RBC: 4.48 x10E6/uL (ref 3.77–5.28)
RDW: 13.1 % (ref 12.3–15.4)
WBC: 11.1 10*3/uL — AB (ref 3.4–10.8)

## 2015-06-29 LAB — CMP14+EGFR
A/G RATIO: 1.5 (ref 1.2–2.2)
ALBUMIN: 4.2 g/dL (ref 3.5–5.5)
ALT: 18 IU/L (ref 0–32)
AST: 16 IU/L (ref 0–40)
Alkaline Phosphatase: 80 IU/L (ref 39–117)
BUN/Creatinine Ratio: 15 (ref 9–23)
BUN: 9 mg/dL (ref 6–24)
Bilirubin Total: 0.7 mg/dL (ref 0.0–1.2)
CALCIUM: 9.2 mg/dL (ref 8.7–10.2)
CO2: 23 mmol/L (ref 18–29)
CREATININE: 0.62 mg/dL (ref 0.57–1.00)
Chloride: 99 mmol/L (ref 96–106)
GFR, EST AFRICAN AMERICAN: 130 mL/min/{1.73_m2} (ref >59)
GFR, EST NON AFRICAN AMERICAN: 113 mL/min/{1.73_m2} (ref >59)
GLOBULIN, TOTAL: 2.8 g/dL (ref 1.5–4.5)
Glucose: 90 mg/dL (ref 65–99)
POTASSIUM: 4.4 mmol/L (ref 3.5–5.2)
SODIUM: 138 mmol/L (ref 134–144)
TOTAL PROTEIN: 7 g/dL (ref 6.0–8.5)

## 2015-06-29 LAB — AMYLASE: Amylase: 40 U/L (ref 31–124)

## 2015-06-29 LAB — LIPASE: Lipase: 29 U/L (ref 0–59)

## 2015-07-02 ENCOUNTER — Telehealth: Payer: Self-pay | Admitting: Family

## 2015-07-02 ENCOUNTER — Other Ambulatory Visit: Payer: Self-pay | Admitting: Family

## 2015-07-04 ENCOUNTER — Other Ambulatory Visit: Payer: Self-pay | Admitting: Family Medicine

## 2015-07-04 ENCOUNTER — Encounter (INDEPENDENT_AMBULATORY_CARE_PROVIDER_SITE_OTHER): Payer: Self-pay

## 2015-07-04 ENCOUNTER — Other Ambulatory Visit (HOSPITAL_COMMUNITY): Payer: Self-pay | Admitting: Family Medicine

## 2015-07-04 DIAGNOSIS — R1011 Right upper quadrant pain: Secondary | ICD-10-CM

## 2015-07-05 ENCOUNTER — Encounter (HOSPITAL_COMMUNITY): Payer: Self-pay

## 2015-07-05 ENCOUNTER — Encounter (HOSPITAL_COMMUNITY)
Admission: RE | Admit: 2015-07-05 | Discharge: 2015-07-05 | Disposition: A | Discharge status: Home / Self Care | Payer: 59 | Source: Ambulatory Visit | Attending: Family Medicine | Admitting: Family Medicine

## 2015-07-05 DIAGNOSIS — R1011 Right upper quadrant pain: Secondary | ICD-10-CM | POA: Diagnosis present

## 2015-07-05 MED ORDER — TECHNETIUM TC 99M MEBROFENIN IV KIT
5.0000 | PACK | Freq: Once | INTRAVENOUS | Status: AC | PRN
  Administered 2015-07-05: 5.4 via INTRAVENOUS

## 2015-07-06 ENCOUNTER — Telehealth: Payer: Self-pay | Admitting: Nurse Practitioner

## 2015-07-06 ENCOUNTER — Other Ambulatory Visit: Payer: Self-pay | Admitting: Family

## 2015-07-06 DIAGNOSIS — R1011 Right upper quadrant pain: Secondary | ICD-10-CM

## 2015-07-06 DIAGNOSIS — R112 Nausea with vomiting, unspecified: Secondary | ICD-10-CM

## 2015-07-06 NOTE — Telephone Encounter (Signed)
Pt has already spoken with Jamelle Haring about imaging results.

## 2015-07-09 ENCOUNTER — Other Ambulatory Visit: Payer: Self-pay | Admitting: Family

## 2015-07-09 MED ORDER — ESCITALOPRAM OXALATE 10 MG PO TABS
10.0000 mg | ORAL_TABLET | Freq: Every day | ORAL | Status: DC
Start: 2015-07-09 — End: 2016-04-24

## 2015-07-09 NOTE — Progress Notes (Signed)
Lexapro Prescription sent to pharmacy related to GAD

## 2015-07-12 ENCOUNTER — Encounter: Payer: Self-pay | Admitting: Family

## 2015-07-12 ENCOUNTER — Ambulatory Visit (INDEPENDENT_AMBULATORY_CARE_PROVIDER_SITE_OTHER): Payer: 59 | Admitting: Family

## 2015-07-12 VITALS — BP 133/86 | HR 70 | Temp 99.0°F | Ht 70.0 in | Wt 279.0 lb

## 2015-07-12 DIAGNOSIS — K219 Gastro-esophageal reflux disease without esophagitis: Secondary | ICD-10-CM

## 2015-07-12 DIAGNOSIS — Z6841 Body Mass Index (BMI) 40.0 and over, adult: Secondary | ICD-10-CM | POA: Diagnosis not present

## 2015-07-12 DIAGNOSIS — R079 Chest pain, unspecified: Secondary | ICD-10-CM

## 2015-07-12 DIAGNOSIS — F411 Generalized anxiety disorder: Secondary | ICD-10-CM

## 2015-07-12 DIAGNOSIS — R1011 Right upper quadrant pain: Secondary | ICD-10-CM | POA: Diagnosis not present

## 2015-07-12 DIAGNOSIS — R110 Nausea: Secondary | ICD-10-CM

## 2015-07-12 DIAGNOSIS — E669 Obesity, unspecified: Secondary | ICD-10-CM | POA: Insufficient documentation

## 2015-07-12 MED ORDER — ONDANSETRON HCL 4 MG PO TABS: 4.0000 mg | ORAL_TABLET | Freq: Three times a day (TID) | ORAL | Status: DC | PRN

## 2015-07-12 MED ORDER — PANTOPRAZOLE SODIUM 20 MG PO TBEC: 20.0000 mg | DELAYED_RELEASE_TABLET | Freq: Every day | ORAL | Status: DC

## 2015-07-12 NOTE — Patient Instructions (Signed)

## 2015-07-12 NOTE — Progress Notes (Signed)
Subjective:    Patient ID: Laura Hansen, female    DOB: 30-Jul-1975, 40 y.o.   MRN: [id number]  Pt presents to the office today with recurrent RUQ pain. PT has had an abdomen ultrasound and HIDA that were negative. PT has an appt with GI on 02/18/15. PT states she is having chest pain since her scans and is unsure if they are related to the abdominal pain or GAD. PT states she is very worried and stressed and was started on lexapro 10 mg which seems to helps, but continues to worry.  Abdominal Pain This is a recurrent problem. The current episode started more than 1 year ago. The onset quality is gradual. The problem occurs intermittently. The problem has been waxing and waning. The pain is located in the RUQ. The pain is at a severity of 8/10. The pain is moderate. The quality of the pain is aching. The abdominal pain radiates to the chest. Associated symptoms include belching, diarrhea, flatus, headaches and nausea. Pertinent negatives include no constipation, dysuria or vomiting. The pain is aggravated by eating. The pain is relieved by nothing. She has tried antacids for the symptoms. The treatment provided no relief. Prior diagnostic workup includes GI consult.      Review of Systems  Gastrointestinal: Positive for nausea, abdominal pain, diarrhea and flatus. Negative for vomiting and constipation.  Genitourinary: Negative for dysuria.  Neurological: Positive for headaches.  All other systems reviewed and are negative.      Objective:   Physical Exam  Constitutional: She is oriented to person, place, and time. She appears well-developed and well-nourished. No distress.  HENT:  Head: Normocephalic.  Eyes: Pupils are equal, round, and reactive to light.  Neck: Normal range of motion. Neck supple. No thyromegaly present.  Cardiovascular: Normal rate, regular rhythm, normal heart sounds and intact distal pulses.   No murmur heard. Pulmonary/Chest: Effort normal and breath sounds  normal. No respiratory distress. She has no wheezes.  Abdominal: Soft. Bowel sounds are normal. She exhibits no distension. There is tenderness (RUQ pain). There is guarding (RUQ pain).  Musculoskeletal: Normal range of motion. She exhibits no edema or tenderness.  Neurological: She is alert and oriented to person, place, and time. She has normal reflexes. No cranial nerve deficit.  Skin: Skin is warm and dry.  Psychiatric: She has a normal mood and affect. Her behavior is normal. Judgment and thought content normal.  Vitals reviewed.   BP 133/86 mmHg  Pulse 70  Temp(Src) 99 F (37.2 C) (Oral)  Ht 5\' 10"  (1.778 m)  Wt 279 lb (126.554 kg)  BMI 40.03 kg/m2  LMP 06/28/2015 (Exact Date)   EKG- Sinus Rhythm     Assessment & Plan:  1. RUQ pain - pantoprazole (PROTONIX) 20 MG tablet; Take 1 tablet (20 mg total) by mouth daily.  Dispense: 90 tablet; Refill: 1  2. Nausea without vomiting -Pt started on Protonix 20 mg today -Pt given rx of zofran - pantoprazole (PROTONIX) 20 MG tablet; Take 1 tablet (20 mg total) by mouth daily.  Dispense: 90 tablet; Refill: 1 - ondansetron (ZOFRAN) 4 MG tablet; Take 1 tablet (4 mg total) by mouth every 8 (eight) hours as needed for nausea or vomiting.  Dispense: 20 tablet; Refill: 0  3. Chest pain, unspecified chest pain type Related to GAD? - EKG 12-Lead  4. Morbid obesity with BMI of 40.0-44.9, adult (HCC) Weight loss encouraged   5. Gastroesophageal reflux disease, esophagitis presence not specified -Diet discussed -Protonix  started today - pantoprazole (PROTONIX) 20 MG tablet; Take 1 tablet (20 mg total) by mouth daily.  Dispense: 90 tablet; Refill: 1   6. GAD (generalized anxiety disorder) -Continue lexapro  *Keep GI appts  [name], FNP

## 2015-07-17 ENCOUNTER — Ambulatory Visit: Payer: 59 | Admitting: Family Medicine

## 2015-07-19 ENCOUNTER — Encounter: Payer: Self-pay | Admitting: Gastroenterology

## 2015-07-19 ENCOUNTER — Ambulatory Visit (INDEPENDENT_AMBULATORY_CARE_PROVIDER_SITE_OTHER): Payer: 59 | Admitting: Gastroenterology

## 2015-07-19 VITALS — BP 118/60 | HR 72 | Ht 70.0 in | Wt 275.0 lb

## 2015-07-19 DIAGNOSIS — R110 Nausea: Secondary | ICD-10-CM

## 2015-07-19 DIAGNOSIS — R1011 Right upper quadrant pain: Secondary | ICD-10-CM

## 2015-07-19 NOTE — Patient Instructions (Signed)

## 2015-07-25 ENCOUNTER — Encounter: Payer: Self-pay | Admitting: Gastroenterology

## 2015-07-25 DIAGNOSIS — R110 Nausea: Secondary | ICD-10-CM | POA: Insufficient documentation

## 2015-07-25 DIAGNOSIS — R1011 Right upper quadrant pain: Secondary | ICD-10-CM | POA: Insufficient documentation

## 2015-07-25 NOTE — Progress Notes (Signed)
07/19/2015 Laura Hansen [id number] 1975-07-27   HISTORY OF PRESENT ILLNESS:  This is a pleasant 40 year-old female who is previously known to Dr. Carlean Hansen for complaints of diarrhea for which she went a colonoscopy in July 2013. That study was normal with 1 diminutive polyp found in the distal transverse colon. The polyp was actually benign polypoid mucosa with benign lymphoid aggregate and random biopsies throughout the colon were also normal.  Diarrhea was suspected to be secondary to irritable bowel. She also had EGD by him in September 2011 for complaints of left upper quadrant abdominal pain and was found have a 2 cm hiatal hernia. She is here today with complaints of right upper quadrant abdominal pain associated with nausea and some vomiting. She tells me this pain has been present for the past couple of weeks but feels that maybe she had similar symptoms about one year ago. Pain comes and goes and is aggravated by eating but she has tried to change her diet and that has provided no relief. Ultrasound of the gallbladder was performed and showed fatty liver but was otherwise unremarkable. HIDA scan showed sluggish but within normal limits gallbladder ejection fraction of 35%. She does admit to a lot of stress and anxiety recently as well, however.  She was placed on pantoprazole 20 mg daily by her PCP recently.  Referred back here on this occasion by her PCP's office, Laura Hansen, Laura Hansen.   Past Medical History  Diagnosis Date  . Hematuria   . Anxiety   . Depression   . Hepatic steatosis   . H. pylori infection   . GERD (gastroesophageal reflux disease)   . Eczema     as a child   Past Surgical History  Procedure Laterality Date  . Upper gastrointestinal endoscopy      reports that she has never smoked. She has never used smokeless tobacco. She reports that she drinks alcohol. She reports that she does not use illicit drugs. family history includes Breast cancer in her mother.  There is no history of Colon cancer, Allergic rhinitis, Asthma, Eczema, or Urticaria. Allergies  Allergen Reactions  . Amoxicillin   . Codeine       Outpatient Encounter Prescriptions as of 07/19/2015  Medication Sig  . DiphenhydrAMINE HCl (BENADRYL ALLERGY PO) Take by mouth. Reported on 06/28/2015  . EPINEPHrine 0.3 mg/0.3 mL IJ SOAJ injection Inject 0.3 mLs (0.3 mg total) into the muscle once.  . escitalopram (LEXAPRO) 10 MG tablet Take 1 tablet (10 mg total) by mouth daily.  . fluticasone (FLONASE) 50 MCG/ACT nasal spray Place 2 sprays into both nostrils daily.  . ondansetron (ZOFRAN) 4 MG tablet Take 1 tablet (4 mg total) by mouth every 8 (eight) hours as needed for nausea or vomiting.  . pantoprazole (PROTONIX) 20 MG tablet Take 1 tablet (20 mg total) by mouth daily.  . RaNITidine HCl (ZANTAC PO) Take by mouth.   No facility-administered encounter medications on file as of 07/19/2015.     REVIEW OF SYSTEMS  : All other systems reviewed and negative except where noted in the History of Present Illness.   PHYSICAL EXAM: BP 118/60 mmHg  Pulse 72  Ht 5\' 10"  (1.778 m)  Wt 275 lb (124.739 kg)  BMI 39.46 kg/m2  LMP 06/28/2015 (Exact Date) General: Well developed white female in no acute distress Head: Normocephalic and atraumatic Eyes:  Sclerae anicteric, conjunctiva pink. Ears: Normal auditory acuity  Lungs: Clear throughout to auscultation Heart: Regular  rate and rhythm Abdomen: Soft, non-distended.  Normal bowel sounds.  RUQ TTP. Musculoskeletal: Symmetrical with no gross deformities  Skin: No lesions on visible extremities Extremities: No edema  Neurological: Alert oriented x 4, grossly non-focal Psychological:  Alert and cooperative. Normal mood and affect  ASSESSMENT AND PLAN: -40 year old female with complaints of right upper quadrant abdominal pain and nausea with some vomiting. Symptoms sound very suspicious for gallbladder pathology, however, ultrasound normal and  HIDA scan showing borderline gallbladder ejection fraction.  I don't think that it is unreasonable to schedule her for EGD for evaluation to rule out reflux, ulcer disease, etc. She may end up still needing surgical consultation and possible cholecystectomy, but then at least we've done other evaluation to rule out other possible issues.  The risks, benefits, and alternatives to EGD were discussed with the patient and she consents to proceed.    CC:  Laura Balloon, FNP

## 2015-07-31 ENCOUNTER — Encounter: Payer: Self-pay | Admitting: Internal Medicine

## 2015-08-03 ENCOUNTER — Encounter: Payer: Self-pay | Admitting: Internal Medicine

## 2015-08-03 ENCOUNTER — Telehealth: Payer: Self-pay

## 2015-08-03 ENCOUNTER — Ambulatory Visit (AMBULATORY_SURGERY_CENTER): Payer: 59 | Admitting: Internal Medicine

## 2015-08-03 VITALS — BP 117/79 | HR 72 | Temp 99.1°F | Resp 17 | Ht 70.0 in | Wt 275.0 lb

## 2015-08-03 DIAGNOSIS — R1011 Right upper quadrant pain: Secondary | ICD-10-CM | POA: Diagnosis present

## 2015-08-03 DIAGNOSIS — R110 Nausea: Secondary | ICD-10-CM

## 2015-08-03 DIAGNOSIS — R112 Nausea with vomiting, unspecified: Secondary | ICD-10-CM

## 2015-08-03 MED ORDER — SODIUM CHLORIDE 0.9 % IV SOLN: 500.0000 mL | INTRAVENOUS | Status: DC

## 2015-08-03 NOTE — Op Note (Signed)
[facility] Patient Name: Laura Hansen Procedure Date: 08/03/2015 3:41 PM MRN: [id number] Endoscopist: Gatha Mayer , MD Age: 40 Referring MD:  Date of Birth: Jan 26, 1976 Gender: Female Procedure:                Upper GI endoscopy Indications:              Abdominal pain in the right upper quadrant Medicines:                Propofol per Anesthesia, Monitored Anesthesia Care Procedure:                Pre-Anesthesia Assessment:                           - Prior to the procedure, a History and Physical                            was performed, and patient medications and                            allergies were reviewed. The patient's tolerance of                            previous anesthesia was also reviewed. The risks                            and benefits of the procedure and the sedation                            options and risks were discussed with the patient.                            All questions were answered, and informed consent                            was obtained. Prior Anticoagulants: The patient has                            taken no previous anticoagulant or antiplatelet                            agents. ASA Grade Assessment: II - A patient with                            mild systemic disease. After reviewing the risks                            and benefits, the patient was deemed in                            satisfactory condition to undergo the procedure.                           After obtaining informed consent, the endoscope was  passed under direct vision. Throughout the                            procedure, the patient's blood pressure, pulse, and                            oxygen saturations were monitored continuously. The                            Model GIF-[id number]) scope was introduced                            through the mouth, and advanced to the second part                            of  duodenum. The upper GI endoscopy was                            accomplished without difficulty. The patient                            tolerated the procedure well. Scope In: Scope Out: Findings:                 The esophagus was normal.                           The examined duodenum was normal.                           A small hiatal hernia was present. Complications:            No immediate complications. Estimated Blood Loss:     Estimated blood loss: none. Impression:               - Normal esophagus.                           - Normal examined duodenum.                           - Small hiatal hernia.                           - No specimens collected. Recommendation:           - Patient has a contact number available for                            emergencies. The signs and symptoms of potential                            delayed complications were discussed with the                            patient. Return to normal activities tomorrow.  Written discharge instructions were provided to the                            patient.                           - Resume previous diet.                           - Continue present medications.                           - Refer to a surgeon at appointment to be scheduled.                           - Evaluate and treat RUQ pain that sounds like it                            could be from gallbladder.                           Dr. Greer Pickerel Gatha Mayer, MD 08/03/2015 4:06:48 PM This report has been signed electronically.

## 2015-08-03 NOTE — Patient Instructions (Addendum)
This looks normal Will refer you to a surgeon to consider removing the gallbladder.  I appreciate the opportunity to care for you. Gatha Mayer, MD, FACG YOU HAD AN ENDOSCOPIC PROCEDURE TODAY AT [facility]:   Refer to the procedure report that was given to you for any specific questions about what was found during the examination.  If the procedure report does not answer your questions, please call your gastroenterologist to clarify.  If you requested that your care partner not be given the details of your procedure findings, then the procedure report has been included in a sealed envelope for you to review at your convenience later.  YOU SHOULD EXPECT: Some feelings of bloating in the abdomen. Passage of more gas than usual.  Walking can help get rid of the air that was put into your GI tract during the procedure and reduce the bloating. If you had a lower endoscopy (such as a colonoscopy or flexible sigmoidoscopy) you may notice spotting of blood in your stool or on the toilet paper. If you underwent a bowel prep for your procedure, you may not have a normal bowel movement for a few days.  Please Note:  You might notice some irritation and congestion in your nose or some drainage.  This is from the oxygen used during your procedure.  There is no need for concern and it should clear up in a day or so.  SYMPTOMS TO REPORT IMMEDIATELY:   Following lower endoscopy (colonoscopy or flexible sigmoidoscopy):  Excessive amounts of blood in the stool  Significant tenderness or worsening of abdominal pains  Swelling of the abdomen that is new, acute  Fever of 100F or higher   Following upper endoscopy (EGD)  Vomiting of blood or coffee ground material  New chest pain or pain under the shoulder blades  Painful or persistently difficult swallowing  New shortness of breath  Fever of 100F or higher  Black, tarry-looking stools  For urgent or emergent issues, a  gastroenterologist can be reached at any hour by calling [phone].   DIET: Your first meal following the procedure should be a small meal and then it is ok to progress to your normal diet. Heavy or fried foods are harder to digest and may make you feel nauseous or bloated.  Likewise, meals heavy in dairy and vegetables can increase bloating.  Drink plenty of fluids but you should avoid alcoholic beverages for 24 hours.  ACTIVITY:  You should plan to take it easy for the rest of today and you should NOT DRIVE or use heavy machinery until tomorrow (because of the sedation medicines used during the test).    FOLLOW UP: Our staff will call the number listed on your records the next business day following your procedure to check on you and address any questions or concerns that you may have regarding the information given to you following your procedure. If we do not reach you, we will leave a message.  However, if you are feeling well and you are not experiencing any problems, there is no need to return our call.  We will assume that you have returned to your regular daily activities without incident.  If any biopsies were taken you will be contacted by phone or by letter within the next 1-3 weeks.  Please call us at [phone] if you have not heard about the biopsies in 3 weeks.    SIGNATURES/CONFIDENTIALITY: You and/or your care partner have signed paperwork  which will be entered into your electronic medical record.  These signatures attest to the fact that that the information above on your After Visit Summary has been reviewed and is understood.  Full responsibility of the confidentiality of this discharge information lies with you and/or your care-partner.   PLEASE READ GERD/HIATAL HERNIA HANDOUT PROVIDED

## 2015-08-03 NOTE — Progress Notes (Signed)
Report to PACU, RN, vss, BBS= Clear.  

## 2015-08-03 NOTE — Telephone Encounter (Signed)
-----   Message from Greggory Keen, LPN sent at [id number]  4:40 PM EDT ----- Regarding: FW: referral   ----- Message -----    From: Gatha Mayer, MD    Sent: 08/03/2015   4:07 PM      To: Greggory Keen, LPN Subject: referral                                       Please arrange appt dr. Greer Pickerel re: RUQ pain

## 2015-08-03 NOTE — Progress Notes (Signed)
Patient care and charting done by Tilford Pillar

## 2015-08-05 NOTE — Progress Notes (Signed)
Agree with Ms. Zehr's management.  Carl E. Gessner, MD, FACG  

## 2015-08-06 NOTE — Telephone Encounter (Signed)
  Follow up Call-  Call back number 08/03/2015  Post procedure Call Back phone  # [phone]  Permission to leave phone message Yes     Patient questions:  Do you have a fever, pain , or abdominal swelling? No. Pain Score  0 *  Have you tolerated food without any problems? Yes.    Have you been able to return to your normal activities? Yes.    Do you have any questions about your discharge instructions: Diet   No. Medications  No. Follow up visit  No.  Do you have questions or concerns about your Care? No.  Actions: * If pain score is 4 or above: No action needed, pain <4.

## 2015-08-13 ENCOUNTER — Telehealth: Payer: Self-pay

## 2015-08-13 NOTE — Telephone Encounter (Signed)
Pt scheduled to see Dr. Redmond Pulling with [facility] for possible gallbladder surgery 08/16/15. Pt to arrive at the office at 4pm, appt at 4:30pm. Pt aware of appt.

## 2015-09-03 ENCOUNTER — Other Ambulatory Visit: Payer: Self-pay | Admitting: General Surgery

## 2015-10-09 ENCOUNTER — Other Ambulatory Visit: Payer: Self-pay | Admitting: Nurse Practitioner

## 2015-10-09 MED ORDER — SCOPOLAMINE 1 MG/3DAYS TD PT72: 1.0000 | MEDICATED_PATCH | TRANSDERMAL | 1 refills | Status: DC

## 2015-11-19 ENCOUNTER — Other Ambulatory Visit: Payer: Self-pay | Admitting: Obstetrics & Gynecology

## 2015-11-19 DIAGNOSIS — N6489 Other specified disorders of breast: Secondary | ICD-10-CM

## 2015-12-14 ENCOUNTER — Ambulatory Visit
Admission: RE | Admit: 2015-12-14 | Discharge: 2015-12-14 | Disposition: A | Discharge status: Home / Self Care | Payer: 59 | Source: Ambulatory Visit | Attending: Obstetrics & Gynecology | Admitting: Obstetrics & Gynecology

## 2015-12-14 DIAGNOSIS — N6489 Other specified disorders of breast: Secondary | ICD-10-CM

## 2016-02-15 ENCOUNTER — Other Ambulatory Visit: Payer: Self-pay | Admitting: Family

## 2016-02-15 DIAGNOSIS — R1011 Right upper quadrant pain: Secondary | ICD-10-CM

## 2016-02-15 DIAGNOSIS — K219 Gastro-esophageal reflux disease without esophagitis: Secondary | ICD-10-CM

## 2016-02-15 DIAGNOSIS — R110 Nausea: Secondary | ICD-10-CM

## 2016-04-21 ENCOUNTER — Other Ambulatory Visit: Payer: Self-pay | Admitting: Obstetrics & Gynecology

## 2016-04-21 DIAGNOSIS — N6489 Other specified disorders of breast: Secondary | ICD-10-CM

## 2016-04-24 ENCOUNTER — Encounter: Payer: Self-pay | Admitting: Family

## 2016-04-24 ENCOUNTER — Ambulatory Visit (INDEPENDENT_AMBULATORY_CARE_PROVIDER_SITE_OTHER): Payer: 59 | Admitting: Family

## 2016-04-24 VITALS — BP 125/89 | HR 74 | Temp 99.2°F | Ht 70.0 in | Wt 266.0 lb

## 2016-04-24 DIAGNOSIS — E669 Obesity, unspecified: Secondary | ICD-10-CM

## 2016-04-24 DIAGNOSIS — Z0000 Encounter for general adult medical examination without abnormal findings: Secondary | ICD-10-CM

## 2016-04-24 DIAGNOSIS — F411 Generalized anxiety disorder: Secondary | ICD-10-CM

## 2016-04-24 DIAGNOSIS — Z6841 Body Mass Index (BMI) 40.0 and over, adult: Secondary | ICD-10-CM

## 2016-04-24 DIAGNOSIS — T7840XD Allergy, unspecified, subsequent encounter: Secondary | ICD-10-CM

## 2016-04-24 DIAGNOSIS — K219 Gastro-esophageal reflux disease without esophagitis: Secondary | ICD-10-CM | POA: Diagnosis not present

## 2016-04-24 NOTE — Patient Instructions (Signed)
Health Maintenance, Female Adopting a healthy lifestyle and getting preventive care can go a long way to promote health and wellness. Talk with your health care provider about what schedule of regular examinations is right for you. This is a good chance for you to check in with your provider about disease prevention and staying healthy. In between checkups, there are plenty of things you can do on your own. Experts have done a lot of research about which lifestyle changes and preventive measures are most likely to keep you healthy. Ask your health care provider for more information. Weight and diet Eat a healthy diet  Be sure to include plenty of vegetables, fruits, low-fat dairy products, and lean protein.  Do not eat a lot of foods high in solid fats, added sugars, or salt.  Get regular exercise. This is one of the most important things you can do for your health.  Most adults should exercise for at least 150 minutes each week. The exercise should increase your heart rate and make you sweat (moderate-intensity exercise).  Most adults should also do strengthening exercises at least twice a week. This is in addition to the moderate-intensity exercise. Maintain a healthy weight  Body mass index (BMI) is a measurement that can be used to identify possible weight problems. It estimates body fat based on height and weight. Your health care provider can help determine your BMI and help you achieve or maintain a healthy weight.  For females 76 years of age and older:  A BMI below 18.5 is considered underweight.  A BMI of 18.5 to 24.9 is normal.  A BMI of 25 to 29.9 is considered overweight.  A BMI of 30 and above is considered obese. Watch levels of cholesterol and blood lipids  You should start having your blood tested for lipids and cholesterol at 41 years of age, then have this test every 5 years.  You may need to have your cholesterol levels checked more often if:  Your lipid or  cholesterol levels are high.  You are older than 41 years of age.  You are at high risk for heart disease. Cancer screening Lung Cancer  Lung cancer screening is recommended for adults 64-42 years old who are at high risk for lung cancer because of a history of smoking.  A yearly low-dose CT scan of the lungs is recommended for people who:  Currently smoke.  Have quit within the past 15 years.  Have at least a 30-pack-year history of smoking. A pack year is smoking an average of one pack of cigarettes a day for 1 year.  Yearly screening should continue until it has been 15 years since you quit.  Yearly screening should stop if you develop a health problem that would prevent you from having lung cancer treatment. Breast Cancer  Practice breast self-awareness. This means understanding how your breasts normally appear and feel.  It also means doing regular breast self-exams. Let your health care provider know about any changes, no matter how small.  If you are in your 20s or 30s, you should have a clinical breast exam (CBE) by a health care provider every 1-3 years as part of a regular health exam.  If you are 34 or older, have a CBE every year. Also consider having a breast X-ray (mammogram) every year.  If you have a family history of breast cancer, talk to your health care provider about genetic screening.  If you are at high risk for breast cancer, talk  to your health care provider about having an MRI and a mammogram every year.  Breast cancer gene (BRCA) assessment is recommended for women who have family members with BRCA-related cancers. BRCA-related cancers include:  Breast.  Ovarian.  Tubal.  Peritoneal cancers.  Results of the assessment will determine the need for genetic counseling and BRCA1 and BRCA2 testing. Cervical Cancer  Your health care provider may recommend that you be screened regularly for cancer of the pelvic organs (ovaries, uterus, and vagina).  This screening involves a pelvic examination, including checking for microscopic changes to the surface of your cervix (Pap test). You may be encouraged to have this screening done every 3 years, beginning at age 24.  For women ages 66-65, health care providers may recommend pelvic exams and Pap testing every 3 years, or they may recommend the Pap and pelvic exam, combined with testing for human papilloma virus (HPV), every 5 years. Some types of HPV increase your risk of cervical cancer. Testing for HPV may also be done on women of any age with unclear Pap test results.  Other health care providers may not recommend any screening for nonpregnant women who are considered low risk for pelvic cancer and who do not have symptoms. Ask your health care provider if a screening pelvic exam is right for you.  If you have had past treatment for cervical cancer or a condition that could lead to cancer, you need Pap tests and screening for cancer for at least 20 years after your treatment. If Pap tests have been discontinued, your risk factors (such as having a new sexual partner) need to be reassessed to determine if screening should resume. Some women have medical problems that increase the chance of getting cervical cancer. In these cases, your health care provider may recommend more frequent screening and Pap tests. Colorectal Cancer  This type of cancer can be detected and often prevented.  Routine colorectal cancer screening usually begins at 41 years of age and continues through 41 years of age.  Your health care provider may recommend screening at an earlier age if you have risk factors for colon cancer.  Your health care provider may also recommend using home test kits to check for hidden blood in the stool.  A small camera at the end of a tube can be used to examine your colon directly (sigmoidoscopy or colonoscopy). This is done to check for the earliest forms of colorectal cancer.  Routine  screening usually begins at age 41.  Direct examination of the colon should be repeated every 5-10 years through 41 years of age. However, you may need to be screened more often if early forms of precancerous polyps or small growths are found. Skin Cancer  Check your skin from head to toe regularly.  Tell your health care provider about any new moles or changes in moles, especially if there is a change in a mole's shape or color.  Also tell your health care provider if you have a mole that is larger than the size of a pencil eraser.  Always use sunscreen. Apply sunscreen liberally and repeatedly throughout the day.  Protect yourself by wearing long sleeves, pants, a wide-brimmed hat, and sunglasses whenever you are outside. Heart disease, diabetes, and high blood pressure  High blood pressure causes heart disease and increases the risk of stroke. High blood pressure is more likely to develop in:  People who have blood pressure in the high end of the normal range (130-139/85-89 mm Hg).  People who are overweight or obese.  People who are African American.  If you are 59-24 years of age, have your blood pressure checked every 3-5 years. If you are 34 years of age or older, have your blood pressure checked every year. You should have your blood pressure measured twice-once when you are at a hospital or clinic, and once when you are not at a hospital or clinic. Record the average of the two measurements. To check your blood pressure when you are not at a hospital or clinic, you can use:  An automated blood pressure machine at a pharmacy.  A home blood pressure monitor.  If you are between 29 years and 60 years old, ask your health care provider if you should take aspirin to prevent strokes.  Have regular diabetes screenings. This involves taking a blood sample to check your fasting blood sugar level.  If you are at a normal weight and have a low risk for diabetes, have this test once  every three years after 41 years of age.  If you are overweight and have a high risk for diabetes, consider being tested at a younger age or more often. Preventing infection Hepatitis B  If you have a higher risk for hepatitis B, you should be screened for this virus. You are considered at high risk for hepatitis B if:  You were born in a country where hepatitis B is common. Ask your health care provider which countries are considered high risk.  Your parents were born in a high-risk country, and you have not been immunized against hepatitis B (hepatitis B vaccine).  You have HIV or AIDS.  You use needles to inject street drugs.  You live with someone who has hepatitis B.  You have had sex with someone who has hepatitis B.  You get hemodialysis treatment.  You take certain medicines for conditions, including cancer, organ transplantation, and autoimmune conditions. Hepatitis C  Blood testing is recommended for:  Everyone born from [birth year] through 1965.  Anyone with known risk factors for hepatitis C. Sexually transmitted infections (STIs)  You should be screened for sexually transmitted infections (STIs) including gonorrhea and chlamydia if:  You are sexually active and are younger than 41 years of age.  You are older than 41 years of age and your health care provider tells you that you are at risk for this type of infection.  Your sexual activity has changed since you were last screened and you are at an increased risk for chlamydia or gonorrhea. Ask your health care provider if you are at risk.  If you do not have HIV, but are at risk, it may be recommended that you take a prescription medicine daily to prevent HIV infection. This is called pre-exposure prophylaxis (PrEP). You are considered at risk if:  You are sexually active and do not regularly use condoms or know the HIV status of your partner(s).  You take drugs by injection.  You are sexually active with a partner  who has HIV. Talk with your health care provider about whether you are at high risk of being infected with HIV. If you choose to begin PrEP, you should first be tested for HIV. You should then be tested every 3 months for as long as you are taking PrEP. Pregnancy  If you are premenopausal and you may become pregnant, ask your health care provider about preconception counseling.  If you may become pregnant, take 400 to 800 micrograms (mcg) of folic acid  every day.  If you want to prevent pregnancy, talk to your health care provider about birth control (contraception). Osteoporosis and menopause  Osteoporosis is a disease in which the bones lose minerals and strength with aging. This can result in serious bone fractures. Your risk for osteoporosis can be identified using a bone density scan.  If you are 4 years of age or older, or if you are at risk for osteoporosis and fractures, ask your health care provider if you should be screened.  Ask your health care provider whether you should take a calcium or vitamin D supplement to lower your risk for osteoporosis.  Menopause may have certain physical symptoms and risks.  Hormone replacement therapy may reduce some of these symptoms and risks. Talk to your health care provider about whether hormone replacement therapy is right for you. Follow these instructions at home:  Schedule regular health, dental, and eye exams.  Stay current with your immunizations.  Do not use any tobacco products including cigarettes, chewing tobacco, or electronic cigarettes.  If you are pregnant, do not drink alcohol.  If you are breastfeeding, limit how much and how often you drink alcohol.  Limit alcohol intake to no more than 1 drink per day for nonpregnant women. One drink equals 12 ounces of beer, 5 ounces of wine, or 1 ounces of hard liquor.  Do not use street drugs.  Do not share needles.  Ask your health care provider for help if you need support  or information about quitting drugs.  Tell your health care provider if you often feel depressed.  Tell your health care provider if you have ever been abused or do not feel safe at home. This information is not intended to replace advice given to you by your health care provider. Make sure you discuss any questions you have with your health care provider. Document Released: 08/19/2010 Document Revised: 07/12/2015 Document Reviewed: 11/07/2014 Elsevier Interactive Patient Education  2017 [employer].

## 2016-04-24 NOTE — Progress Notes (Signed)
   Subjective:    Patient ID: Laura Hansen, female    DOB: Jul 02, 1975, 41 y.o.   MRN: [id number]  PT presents to the office today for CPE without pap.  Gastroesophageal Reflux  She complains of belching and heartburn. She reports no choking, no coughing or no dysphagia. This is a chronic problem. The current episode started more than 1 year ago. The problem occurs frequently. The heartburn is of moderate intensity. The heartburn does not wake her from sleep. She has tried an antacid for the symptoms. The treatment provided mild relief.  Anxiety  Presents for follow-up visit. Symptoms include excessive worry and nervous/anxious behavior. Patient reports no irritability or restlessness. Symptoms occur rarely.        Review of Systems  Constitutional: Negative for irritability.  Respiratory: Negative for cough and choking.   Gastrointestinal: Positive for heartburn. Negative for dysphagia.  Psychiatric/Behavioral: The patient is nervous/anxious.   All other systems reviewed and are negative.      Objective:   Physical Exam  Constitutional: She is oriented to person, place, and time. She appears well-developed and well-nourished. No distress.  obese  HENT:  Head: Normocephalic and atraumatic.  Right Ear: External ear normal.  Mouth/Throat: Oropharynx is clear and moist.  Eyes: Pupils are equal, round, and reactive to light.  Neck: Normal range of motion. Neck supple. No thyromegaly present.  Cardiovascular: Normal rate, regular rhythm, normal heart sounds and intact distal pulses.   No murmur heard. Pulmonary/Chest: Effort normal and breath sounds normal. No respiratory distress. She has no wheezes.  Abdominal: Soft. Bowel sounds are normal. She exhibits no distension. There is no tenderness.  Musculoskeletal: Normal range of motion. She exhibits no edema or tenderness.  Neurological: She is alert and oriented to person, place, and time.  Skin: Skin is warm and dry.    Psychiatric: She has a normal mood and affect. Her behavior is normal. Judgment and thought content normal.  Vitals reviewed.     BP 125/89   Pulse 74   Temp 99.2 F (37.3 C) (Oral)   Ht _0  (1.778 m)   Wt 266 lb (120.7 kg)   BMI 38.17 kg/m      Assessment & Plan:  1. Gastroesophageal reflux disease, esophagitis presence not specified - CMP14+EGFR  2. Morbid obesity with BMI of 40.0-44.9, adult (HCC) - CMP14+EGFR  3. GAD (generalized anxiety disorder) - CMP14+EGFR  4. Allergic reaction, subsequent encounter - CMP14+EGFR  5. Annual physical exam - Anemia Profile B - CMP14+EGFR - Lipid panel - Thyroid Panel With TSH - VITAMIN D 25 Hydroxy (Vit-D Deficiency, Fractures) - Beta HCG, Quant  6. Obesity (BMI 30-39.9)   Continue all meds Labs pending Health Maintenance reviewed Diet and exercise encouraged RTO 6 months  [name], FNP

## 2016-04-25 LAB — ANEMIA PROFILE B
BASOS ABS: 0 10*3/uL (ref 0.0–0.2)
Basos: 0 %
EOS (ABSOLUTE): 0.2 10*3/uL (ref 0.0–0.4)
Eos: 2 %
FOLATE: 10.8 ng/mL (ref >3.0)
Ferritin: 302 ng/mL — ABNORMAL HIGH (ref 15–150)
Hematocrit: 42.8 % (ref 34.0–46.6)
Hemoglobin: 14 g/dL (ref 11.1–15.9)
IMMATURE GRANULOCYTES: 0 %
IRON SATURATION: 21 % (ref 15–55)
Immature Grans (Abs): 0 10*3/uL (ref 0.0–0.1)
Iron: 55 ug/dL (ref 27–159)
LYMPHS ABS: 2.2 10*3/uL (ref 0.7–3.1)
Lymphs: 21 %
MCH: 30 pg (ref 26.6–33.0)
MCHC: 32.7 g/dL (ref 31.5–35.7)
MCV: 92 fL (ref 79–97)
MONOS ABS: 0.6 10*3/uL (ref 0.1–0.9)
Monocytes: 6 %
NEUTROS PCT: 71 %
Neutrophils Absolute: 7.1 10*3/uL — ABNORMAL HIGH (ref 1.4–7.0)
PLATELETS: 291 10*3/uL (ref 150–379)
RBC: 4.66 x10E6/uL (ref 3.77–5.28)
RDW: 13.6 % (ref 12.3–15.4)
Retic Ct Pct: 1.3 % (ref 0.6–2.6)
Total Iron Binding Capacity: 265 ug/dL (ref 250–450)
UIBC: 210 ug/dL (ref 131–425)
Vitamin B-12: 509 pg/mL (ref 232–1245)
WBC: 10.1 10*3/uL (ref 3.4–10.8)

## 2016-04-25 LAB — CMP14+EGFR
ALK PHOS: 78 IU/L (ref 39–117)
ALT: 19 IU/L (ref 0–32)
AST: 14 IU/L (ref 0–40)
Albumin/Globulin Ratio: 1.5 (ref 1.2–2.2)
Albumin: 4.3 g/dL (ref 3.5–5.5)
BUN/Creatinine Ratio: 17 (ref 9–23)
BUN: 10 mg/dL (ref 6–24)
Bilirubin Total: 0.6 mg/dL (ref 0.0–1.2)
CO2: 25 mmol/L (ref 18–29)
CREATININE: 0.59 mg/dL (ref 0.57–1.00)
Calcium: 9.3 mg/dL (ref 8.7–10.2)
Chloride: 100 mmol/L (ref 96–106)
GFR calc Af Amer: 132 mL/min/{1.73_m2} (ref >59)
GFR calc non Af Amer: 114 mL/min/{1.73_m2} (ref >59)
GLOBULIN, TOTAL: 2.9 g/dL (ref 1.5–4.5)
GLUCOSE: 102 mg/dL — AB (ref 65–99)
Potassium: 4.9 mmol/L (ref 3.5–5.2)
SODIUM: 140 mmol/L (ref 134–144)
Total Protein: 7.2 g/dL (ref 6.0–8.5)

## 2016-04-25 LAB — LIPID PANEL
CHOLESTEROL TOTAL: 148 mg/dL (ref 100–199)
Chol/HDL Ratio: 4.2 ratio units (ref 0.0–4.4)
HDL: 35 mg/dL — ABNORMAL LOW (ref >39)
LDL CALC: 97 mg/dL (ref 0–99)
TRIGLYCERIDES: 82 mg/dL (ref 0–149)
VLDL Cholesterol Cal: 16 mg/dL (ref 5–40)

## 2016-04-25 LAB — THYROID PANEL WITH TSH
FREE THYROXINE INDEX: 2.1 (ref 1.2–4.9)
T3 UPTAKE RATIO: 25 % (ref 24–39)
T4, Total: 8.2 ug/dL (ref 4.5–12.0)
TSH: 1.34 u[IU]/mL (ref 0.450–4.500)

## 2016-04-25 LAB — VITAMIN D 25 HYDROXY (VIT D DEFICIENCY, FRACTURES): Vit D, 25-Hydroxy: 27.8 ng/mL — ABNORMAL LOW (ref 30.0–100.0)

## 2016-04-25 LAB — BETA HCG QUANT (REF LAB)

## 2016-04-26 ENCOUNTER — Emergency Department (HOSPITAL_BASED_OUTPATIENT_CLINIC_OR_DEPARTMENT_OTHER): Payer: 59

## 2016-04-26 ENCOUNTER — Encounter (HOSPITAL_BASED_OUTPATIENT_CLINIC_OR_DEPARTMENT_OTHER): Payer: Self-pay | Admitting: *Deleted

## 2016-04-26 ENCOUNTER — Emergency Department (HOSPITAL_BASED_OUTPATIENT_CLINIC_OR_DEPARTMENT_OTHER)
Admission: EM | Admit: 2016-04-26 | Discharge: 2016-04-26 | Disposition: A | Discharge status: Home / Self Care | Payer: 59 | Attending: Emergency Medicine | Admitting: Emergency Medicine

## 2016-04-26 DIAGNOSIS — R51 Headache: Secondary | ICD-10-CM | POA: Insufficient documentation

## 2016-04-26 DIAGNOSIS — R1110 Vomiting, unspecified: Secondary | ICD-10-CM | POA: Diagnosis not present

## 2016-04-26 DIAGNOSIS — R42 Dizziness and giddiness: Secondary | ICD-10-CM | POA: Diagnosis not present

## 2016-04-26 DIAGNOSIS — R5383 Other fatigue: Secondary | ICD-10-CM | POA: Diagnosis not present

## 2016-04-26 DIAGNOSIS — R0789 Other chest pain: Secondary | ICD-10-CM | POA: Diagnosis not present

## 2016-04-26 DIAGNOSIS — Z79899 Other long term (current) drug therapy: Secondary | ICD-10-CM | POA: Diagnosis not present

## 2016-04-26 LAB — CBC WITH DIFFERENTIAL/PLATELET
BASOS PCT: 0 %
Basophils Absolute: 0 10*3/uL (ref 0.0–0.1)
EOS ABS: 0.2 10*3/uL (ref 0.0–0.7)
Eosinophils Relative: 2 %
HEMATOCRIT: 40.7 % (ref 36.0–46.0)
HEMOGLOBIN: 13.8 g/dL (ref 12.0–15.0)
Lymphocytes Relative: 20 %
Lymphs Abs: 1.6 10*3/uL (ref 0.7–4.0)
MCH: 30.1 pg (ref 26.0–34.0)
MCHC: 33.9 g/dL (ref 30.0–36.0)
MCV: 88.9 fL (ref 78.0–100.0)
Monocytes Absolute: 0.6 10*3/uL (ref 0.1–1.0)
Monocytes Relative: 7 %
NEUTROS ABS: 6 10*3/uL (ref 1.7–7.7)
NEUTROS PCT: 71 %
Platelets: 246 10*3/uL (ref 150–400)
RBC: 4.58 MIL/uL (ref 3.87–5.11)
RDW: 12.8 % (ref 11.5–15.5)
WBC: 8.4 10*3/uL (ref 4.0–10.5)

## 2016-04-26 LAB — COMPREHENSIVE METABOLIC PANEL
ALBUMIN: 4.1 g/dL (ref 3.5–5.0)
ALK PHOS: 70 U/L (ref 38–126)
ALT: 21 U/L (ref 14–54)
AST: 20 U/L (ref 15–41)
Anion gap: 7 (ref 5–15)
BUN: 10 mg/dL (ref 6–20)
CHLORIDE: 105 mmol/L (ref 101–111)
CO2: 27 mmol/L (ref 22–32)
CREATININE: 0.63 mg/dL (ref 0.44–1.00)
Calcium: 9.2 mg/dL (ref 8.9–10.3)
GFR calc Af Amer: >60 mL/min (ref >60)
GFR calc non Af Amer: >60 mL/min (ref >60)
GLUCOSE: 105 mg/dL — AB (ref 65–99)
Potassium: 3.8 mmol/L (ref 3.5–5.1)
SODIUM: 139 mmol/L (ref 135–145)
Total Bilirubin: 1 mg/dL (ref 0.3–1.2)
Total Protein: 7.7 g/dL (ref 6.5–8.1)

## 2016-04-26 LAB — URINALYSIS, ROUTINE W REFLEX MICROSCOPIC
BILIRUBIN URINE: NEGATIVE
Glucose, UA: NEGATIVE mg/dL
Hgb urine dipstick: NEGATIVE
KETONES UR: NEGATIVE mg/dL
LEUKOCYTES UA: NEGATIVE
NITRITE: NEGATIVE
PH: 7 (ref 5.0–8.0)
Protein, ur: NEGATIVE mg/dL
SPECIFIC GRAVITY, URINE: 1.002 — AB (ref 1.005–1.030)

## 2016-04-26 LAB — LIPASE, BLOOD: Lipase: 26 U/L (ref 11–51)

## 2016-04-26 LAB — TROPONIN I

## 2016-04-26 MED ORDER — ASPIRIN 81 MG PO CHEW
324.0000 mg | CHEWABLE_TABLET | Freq: Once | ORAL | Status: AC
  Administered 2016-04-26: 324 mg via ORAL
  Filled 2016-04-26: qty 4

## 2016-04-26 MED ORDER — PANTOPRAZOLE SODIUM 40 MG PO TBEC: 40.0000 mg | DELAYED_RELEASE_TABLET | Freq: Every day | ORAL | 0 refills | Status: DC

## 2016-04-26 MED ORDER — SODIUM CHLORIDE 0.9 % IV BOLUS (SEPSIS)
1000.0000 mL | Freq: Once | INTRAVENOUS | Status: AC
  Administered 2016-04-26: 1000 mL via INTRAVENOUS

## 2016-04-26 NOTE — ED Provider Notes (Signed)
[facility] Provider Note   CSN: [id number] Arrival date & time: 04/26/16  4098     History   Chief Complaint Chief Complaint  Patient presents with  . Chest Pain    HPI Laura Hansen is a 41 y.o. female.  HPI  41 year old female presents with chest pain for the past 1 week. She states that the pain is a tightness and occasional pressure. It is on an off all day. Has been worse since yesterday. At one point last night she had vomiting and then her pain felt better. However her pain was worse this morning. Currently she has no pain. The pain does not seem exertional. She has been actively losing weight with [employer] and has lost about 20 pounds over the last 1 month. She has been feeling fatigued consistently over the last 2 weeks, worse whenever moving around or doing activity. There is no shortness of breath. No abdominal pain. The chest tightness symptoms seem to get worse whenever she eats. No back pain. She also complains of being intermittently dizzy for the last 1 week. She describes this as feeling off balance. Denies near-syncope. On and off headache but none currently. No neck pain. She saw her PCP 2 days ago and states she had a battery of tests that came out unremarkable including thyroid studies. No leg swelling, leg pain, recent travel, or surgery. Not on birth control. No hx of HTN, HLD, DM, smoking or FH of CAD  Past Medical History:  Diagnosis Date  . Anxiety   . Depression   . Eczema    as a child  . GERD (gastroesophageal reflux disease)   . H. pylori infection   . Hematuria   . Hepatic steatosis     Patient Active Problem List   Diagnosis Date Noted  . RUQ abdominal pain 07/25/2015  . Obesity (BMI 30-39.9) 07/12/2015  . GAD (generalized anxiety disorder) 07/12/2015  . Allergic reaction 12/18/2014  . GERD 10/10/2009    Past Surgical History:  Procedure Laterality Date  . CHOLECYSTECTOMY    . colonscopy    . UPPER GASTROINTESTINAL  ENDOSCOPY      OB History    No data available       Home Medications    Prior to Admission medications   Medication Sig Start Date End Date Taking? Authorizing Provider  DiphenhydrAMINE HCl (BENADRYL ALLERGY PO) Take by mouth. Reported on 06/28/2015   Yes Historical Provider, MD  EPINEPHrine 0.3 mg/0.3 mL IJ SOAJ injection Inject 0.3 mLs (0.3 mg total) into the muscle once. 12/18/14  Yes Timmothy Euler, MD  ondansetron (ZOFRAN) 4 MG tablet Take 1 tablet (4 mg total) by mouth every 8 (eight) hours as needed for nausea or vomiting. Patient not taking: Reported on 04/24/2016 07/12/15   Sharion Balloon, FNP  pantoprazole (PROTONIX) 40 MG tablet Take 1 tablet (40 mg total) by mouth daily. 04/26/16   Sherwood Gambler, MD    Family History Family History  Problem Relation Age of Onset  . Breast cancer Mother   . Colon cancer Neg Hx   . Allergic rhinitis Neg Hx   . Asthma Neg Hx   . Eczema Neg Hx   . Urticaria Neg Hx     Social History Social History  Substance Use Topics  . Smoking status: Never Smoker  . Smokeless tobacco: Never Used  . Alcohol use Yes     Comment: SOCIAL      Allergies   Peanut-containing  drug products; Amoxicillin; and Codeine   Review of Systems Review of Systems  Constitutional: Positive for fatigue.  Respiratory: Positive for chest tightness. Negative for shortness of breath.   Cardiovascular: Negative for leg swelling.  Gastrointestinal: Positive for vomiting (last night). Negative for abdominal pain.  Musculoskeletal: Negative for back pain.  Neurological: Positive for dizziness and headaches. Negative for tremors and numbness.  All other systems reviewed and are negative.    Physical Exam Updated Vital Signs BP 118/71   Pulse 66   Temp 98.7 F (37.1 C) (Oral)   Resp 15   Ht 5\' 10"  (1.778 m)   Wt 266 lb (120.7 kg)   LMP 04/16/2016 (Exact Date)   SpO2 100%   BMI 38.17 kg/m   Physical Exam  Constitutional: She is oriented to person,  place, and time. She appears well-developed and well-nourished. No distress.  HENT:  Head: Normocephalic and atraumatic.  Right Ear: External ear normal.  Left Ear: External ear normal.  Nose: Nose normal.  Eyes: EOM are normal. Pupils are equal, round, and reactive to light. Right eye exhibits no discharge. Left eye exhibits no discharge.  Neck: Neck supple.  Cardiovascular: Normal rate, regular rhythm, normal heart sounds and intact distal pulses.   Pulses:      Radial pulses are 2+ on the right side, and 2+ on the left side.  Pulmonary/Chest: Effort normal and breath sounds normal. She exhibits no tenderness.  Abdominal: Soft. There is no tenderness.  Neurological: She is alert and oriented to person, place, and time.  CN 3-12 grossly intact. 5/5 strength in all 4 extremities. Grossly normal sensation. Normal finger to nose.   Skin: Skin is warm and dry. She is not diaphoretic.  Nursing note and vitals reviewed.    ED Treatments / Results  Labs (all labs ordered are listed, but only abnormal results are displayed) Labs Reviewed  URINALYSIS, ROUTINE W REFLEX MICROSCOPIC - Abnormal; Notable for the following:       Result Value   Specific Gravity, Urine 1.002 (*)    All other components within normal limits  COMPREHENSIVE METABOLIC PANEL - Abnormal; Notable for the following:    Glucose, Bld 105 (*)    All other components within normal limits  CBC WITH DIFFERENTIAL/PLATELET  TROPONIN I  LIPASE, BLOOD    EKG  EKG Interpretation  Date/Time:  Saturday April 26 2016 09:44:04 EST Ventricular Rate:  72 PR Interval:    QRS Duration: 105 QT Interval:  402 QTC Calculation: 440 R Axis:   52 Text Interpretation:  Sinus rhythm Low voltage, precordial leads no significant change since 2010 Confirmed by Mana Morison MD, Greyden Besecker [phone]) on 04/26/2016 10:00:07 AM       Radiology Dg Chest 2 View  Result Date: 04/26/2016 CLINICAL DATA:  Chest tightness for 2 weeks EXAM: CHEST  2 VIEW  COMPARISON:  03/25/2008 FINDINGS: The heart size and mediastinal contours are within normal limits. Both lungs are clear. The visualized skeletal structures are unremarkable. IMPRESSION: No active cardiopulmonary disease. Electronically Signed   By: Inez Catalina M.D.   On: 04/26/2016 10:58    Procedures Procedures (including critical care time)  Medications Ordered in ED Medications  aspirin chewable tablet 324 mg (324 mg Oral Given 04/26/16 1051)  sodium chloride 0.9 % bolus 1,000 mL (1,000 mLs Intravenous New Bag/Given 04/26/16 1053)     Initial Impression / Assessment and Plan / ED Course  I have reviewed the triage vital signs and the nursing notes.  Pertinent labs & imaging results that were available during my care of the patient were reviewed by me and considered in my medical decision making (see chart for details).  Clinical Course as of Apr 26 1124  Sat Apr 26, 2016  1030 Patient's chest pain is very atypical. I suspicion for ACS is low. No risk factors for PE and is PERC negative for low-risk patient. I highly doubt dissection. Her neurologic exam is unremarkable. Abdominal exam benign. Given the relation to eating, it could be GERD/gastritis. Plan to start on Protonix again.  [SG]  1121 Workup is unremarkable. Doubt ACS. This could be GI given relation to eating. She has are to had her gallbladder removed. She used to be on Protonix and wants to restart this. There could be a component of her weight loss from [employer] they could be causing her fatigue as well. Follow-up with PCP.  [SG]    Clinical Course User Index [SG] Sherwood Gambler, MD    Final Clinical Impressions(s) / ED Diagnoses   Final diagnoses:  Chest tightness    New Prescriptions New Prescriptions   PANTOPRAZOLE (PROTONIX) 40 MG TABLET    Take 1 tablet (40 mg total) by mouth daily.     Sherwood Gambler, MD 04/26/16 1126

## 2016-04-26 NOTE — ED Triage Notes (Signed)
Patient states she has a one week history of intermittent chest tightness, which is associated with generalized fatigue, dizziness,  and one episode of nausea and vomiting.  States after she vomited she felt better.  States she also has an intentional weight lost of 23 lbs within the last month and has increased fatigue. States she  was seen by [facility] PCP two days ago and had blood test completed.  States last night the tightness in her chest got worse.

## 2016-04-26 NOTE — ED Notes (Signed)
Patient transported to X-ray 

## 2016-06-16 ENCOUNTER — Other Ambulatory Visit: Payer: Self-pay | Admitting: Obstetrics & Gynecology

## 2016-06-16 ENCOUNTER — Ambulatory Visit
Admission: RE | Admit: 2016-06-16 | Discharge: 2016-06-16 | Disposition: A | Discharge status: Home / Self Care | Payer: 59 | Source: Ambulatory Visit | Attending: Obstetrics & Gynecology | Admitting: Obstetrics & Gynecology

## 2016-06-16 DIAGNOSIS — R928 Other abnormal and inconclusive findings on diagnostic imaging of breast: Secondary | ICD-10-CM | POA: Diagnosis not present

## 2016-06-16 DIAGNOSIS — N6489 Other specified disorders of breast: Secondary | ICD-10-CM

## 2016-06-16 DIAGNOSIS — N6312 Unspecified lump in the right breast, upper inner quadrant: Secondary | ICD-10-CM | POA: Diagnosis not present

## 2017-01-13 DIAGNOSIS — G4700 Insomnia, unspecified: Secondary | ICD-10-CM | POA: Diagnosis not present

## 2017-01-13 DIAGNOSIS — Z01419 Encounter for gynecological examination (general) (routine) without abnormal findings: Secondary | ICD-10-CM | POA: Diagnosis not present

## 2017-01-13 DIAGNOSIS — N750 Cyst of Bartholin's gland: Secondary | ICD-10-CM | POA: Diagnosis not present

## 2017-04-08 ENCOUNTER — Encounter: Payer: Self-pay | Admitting: Pediatrics

## 2017-04-08 ENCOUNTER — Ambulatory Visit: Payer: 59 | Admitting: Pediatrics

## 2017-04-08 VITALS — BP 136/89 | HR 83 | Temp 98.4°F | Resp 20 | Ht 70.0 in | Wt 278.6 lb

## 2017-04-08 DIAGNOSIS — H65111 Acute and subacute allergic otitis media (mucoid) (sanguinous) (serous), right ear: Secondary | ICD-10-CM | POA: Diagnosis not present

## 2017-04-08 DIAGNOSIS — J101 Influenza due to other identified influenza virus with other respiratory manifestations: Secondary | ICD-10-CM

## 2017-04-08 LAB — VERITOR FLU A/B WAIVED
INFLUENZA A: POSITIVE — AB
INFLUENZA B: NEGATIVE

## 2017-04-08 MED ORDER — AZITHROMYCIN 250 MG PO TABS: ORAL_TABLET | ORAL | 0 refills | Status: DC

## 2017-04-08 MED ORDER — OSELTAMIVIR PHOSPHATE 75 MG PO CAPS: 75.0000 mg | ORAL_CAPSULE | Freq: Two times a day (BID) | ORAL | 0 refills | Status: AC

## 2017-04-08 NOTE — Patient Instructions (Signed)

## 2017-04-08 NOTE — Progress Notes (Signed)
  Subjective:   Patient ID: Laura Hansen, female    DOB: [birth year], 42 y.o.   MRN: [id number] CC: Emesis; Chills; Nasal Congestion; and Ear Pain  HPI: Laura Hansen is a 42 y.o. female presenting for Emesis; Chills; Nasal Congestion; and Ear Pain  Taking theraflu, flonase, elderberry juice. Started about 48h ago. Temp up to 99 at home. One episode of vomiting a couple nights, seemed unrelated to current symptoms. Coughing some now, headache and chest congestion bothering her the most. Cough not productive. Feels very stopped up in sinuses.  Used neti pot last night, felt R ear pop, has had pain since then. Fells stopped up.   Relevant past medical, surgical, family and social history reviewed. Allergies and medications reviewed and updated. Social History   Tobacco Use  Smoking Status Never Smoker  Smokeless Tobacco Never Used   ROS: Per HPI   Objective:    BP 136/89   Pulse 83   Temp 98.4 F (36.9 C) (Oral)   Resp 20   Ht 5\' 10"  (1.778 m)   Wt 278 lb 9.6 oz (126.4 kg)   SpO2 100%   BMI 39.97 kg/m   Wt Readings from Last 3 Encounters:  04/08/17 278 lb 9.6 oz (126.4 kg)  04/26/16 266 lb (120.7 kg)  04/24/16 266 lb (120.7 kg)    Gen: NAD, alert, cooperative with exam, NCAT EYES: EOMI, no conjunctival injection, or no icterus ENT:  R TM red, bulging, L TM with white fluid, pink, OP with mild erythema CV: NRRR, normal S1/S2, no murmur, distal pulses 2+ b/l Resp: CTABL, no wheezes, normal WOB Ext: No edema, warm Neuro: Alert and oriented, strength equal b/l UE and LE, coordination grossly normal MSK: normal muscle bulk  Assessment & Plan:  Laura Hansen was seen today for emesis, chills, nasal congestion and ear pain.  Diagnoses and all orders for this visit:  Acute mucoid otitis media of right ear Start below if ear pain not improving next 24h -     azithromycin (ZITHROMAX) 250 MG tablet; Take 2 the first day and then one each day after.  Influenza A Flu  positive, start below Discussed symptom care, return precautions OK to stay out of work until Monday, go back only if symptoms have improved.  -     oseltamivir (TAMIFLU) 75 MG capsule; Take 1 capsule (75 mg total) by mouth 2 (two) times daily for 5 days.   Follow up plan: Return if symptoms worsen or fail to improve. Assunta Found, MD [place]

## 2017-04-08 NOTE — Addendum Note (Signed)
Addended by: Wardell Heath on: 04/08/2017 03:26 PM   Modules accepted: Orders

## 2017-04-13 ENCOUNTER — Telehealth: Payer: Self-pay | Admitting: Pediatrics

## 2017-04-13 ENCOUNTER — Telehealth: Payer: Self-pay | Admitting: Family

## 2017-04-13 MED ORDER — BENZONATATE 200 MG PO CAPS: 200.0000 mg | ORAL_CAPSULE | Freq: Three times a day (TID) | ORAL | 1 refills | Status: DC | PRN

## 2017-04-13 NOTE — Telephone Encounter (Signed)
A cough can last several weeks. I sent in tessalon Prescription to her pharmacy. Is she running a fever?

## 2017-04-13 NOTE — Telephone Encounter (Signed)
It looks like Dr. Evette Doffing gave patient Laura Hansen and Zpak. Has she completed the Zpak? If so she needs to be seen.

## 2017-04-13 NOTE — Telephone Encounter (Signed)
Note made in error

## 2017-04-13 NOTE — Telephone Encounter (Signed)
She has already been taking Tesselan. It moved into her chest over the weekend and she doesn't want it to turn into pneumonia

## 2017-04-13 NOTE — Telephone Encounter (Signed)
Aware. Start Zpk, mucinex, stay hydrated and keep appointment Wednesday if not improving.

## 2017-04-13 NOTE — Telephone Encounter (Deleted)
Note made in error

## 2017-04-15 ENCOUNTER — Ambulatory Visit: Payer: 59 | Admitting: Pediatrics

## 2017-04-16 ENCOUNTER — Encounter: Payer: Self-pay | Admitting: Pediatrics

## 2017-04-16 ENCOUNTER — Ambulatory Visit: Payer: 59 | Admitting: Pediatrics

## 2017-04-16 VITALS — BP 135/85 | HR 80 | Temp 97.4°F | Resp 22 | Ht 70.0 in | Wt 277.0 lb

## 2017-04-16 DIAGNOSIS — R05 Cough: Secondary | ICD-10-CM | POA: Diagnosis not present

## 2017-04-16 DIAGNOSIS — J101 Influenza due to other identified influenza virus with other respiratory manifestations: Secondary | ICD-10-CM

## 2017-04-16 DIAGNOSIS — H6690 Otitis media, unspecified, unspecified ear: Secondary | ICD-10-CM

## 2017-04-16 DIAGNOSIS — R059 Cough, unspecified: Secondary | ICD-10-CM

## 2017-04-16 MED ORDER — HYDROCODONE-HOMATROPINE 5-1.5 MG/5ML PO SYRP: 5.0000 mL | ORAL_SOLUTION | Freq: Four times a day (QID) | ORAL | 0 refills | Status: DC | PRN

## 2017-04-16 NOTE — Progress Notes (Signed)
  Subjective:   Patient ID: Laura Hansen, female    DOB: 1975/06/01, 42 y.o.   MRN: [id number] CC: Mucus (green, yellow, red ball); Shortness of Breath (with any exertion); Fatigue; and Fever  HPI: Laura Hansen is a 42 y.o. female presenting for Mucus (green, yellow, red ball); Shortness of Breath (with any exertion); Fatigue; and Fever Seen last week and diagnosed with the flu.  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  She feels tired and exhausted.  Achy all over.  She has been taking Tylenol and ibuprofen when she had a fever. Temp 99.4 2 days ago.  No temperature since then. Took tamiflu for 4 days, made her feel lightheaded and dizzy so she stopped. Started on azithromycin 3 days ago because ear pain was getting worse.  Had a middle ear effusion on exam when she was seen 1 week ago.  Cough is bothering her the most.  Coughing throughout the day and at night.  Keeping her awake at night.  Sometimes productive.  Gets tired with exertion.  Relevant past medical, surgical, family and social history reviewed. Allergies and medications reviewed and updated. Social History   Tobacco Use  Smoking Status Never Smoker  Smokeless Tobacco Never Used   ROS: Per HPI   Objective:    BP 135/85   Pulse 80   Temp (!) 97.4 F (36.3 C) (Oral)   Resp (!) 22   Ht 5\' 10"  (1.778 m)   Wt 277 lb (125.6 kg)   SpO2 99%   BMI 39.75 kg/m   Wt Readings from Last 3 Encounters:  04/16/17 277 lb (125.6 kg)  04/08/17 278 lb 9.6 oz (126.4 kg)  04/26/16 266 lb (120.7 kg)    Gen: NAD, alert, cooperative with exam, NCAT, congested EYES: EOMI, no conjunctival injection, or no icterus ENT:  TMs dull gray b/l, OP without erythema LYMPH: no cervical LAD CV: NRRR, normal S1/S2, no murmur, distal pulses 2+ b/l Resp: CTABL, no wheezes, normal WOB Ext: No edema, warm Neuro: Alert and  oriented MSK: normal muscle bulk  Assessment & Plan:  Laura Hansen was seen today for mucus, shortness of breath, fatigue and fever.  Diagnoses and all orders for this visit:  Cough Take as needed for cough, do not use before driving or using heavy machinery as can cause drowsiness.  She thinks her husband is returning tonight. -     HYDROcodone-homatropine (HYCODAN) 5-1.5 MG/5ML syrup; Take 5 mLs by mouth every 6 (six) hours as needed for cough.  Influenza A Discussed most symptoms probably still related to the flu.  Lung exam normal.  O2 sats normal.  Encouraged rest, lots of fluids, Tylenol and ibuprofen as needed.  Acute otitis media, unspecified otitis media type On azithromycin.  Has 1 more day.  Follow up plan: Return if symptoms worsen or fail to improve.  Return precautions discussed. Assunta Found, MD [place]

## 2017-04-27 ENCOUNTER — Encounter: Payer: Self-pay | Admitting: Family Medicine

## 2017-04-27 ENCOUNTER — Ambulatory Visit: Payer: 59 | Admitting: Family Medicine

## 2017-04-27 ENCOUNTER — Telehealth: Payer: Self-pay | Admitting: Family

## 2017-04-27 ENCOUNTER — Ambulatory Visit (INDEPENDENT_AMBULATORY_CARE_PROVIDER_SITE_OTHER): Payer: 59

## 2017-04-27 VITALS — BP 123/82 | HR 78 | Temp 99.0°F | Ht 70.0 in | Wt 275.0 lb

## 2017-04-27 DIAGNOSIS — J40 Bronchitis, not specified as acute or chronic: Secondary | ICD-10-CM | POA: Diagnosis not present

## 2017-04-27 DIAGNOSIS — R05 Cough: Secondary | ICD-10-CM | POA: Diagnosis not present

## 2017-04-27 DIAGNOSIS — J4531 Mild persistent asthma with (acute) exacerbation: Secondary | ICD-10-CM

## 2017-04-27 MED ORDER — METHYLPREDNISOLONE ACETATE 80 MG/ML IJ SUSP
80.0000 mg | Freq: Once | INTRAMUSCULAR | Status: AC
  Administered 2017-04-27: 80 mg via INTRAMUSCULAR

## 2017-04-27 MED ORDER — CEFDINIR 300 MG PO CAPS: 300.0000 mg | ORAL_CAPSULE | Freq: Two times a day (BID) | ORAL | 0 refills | Status: DC

## 2017-04-27 NOTE — Telephone Encounter (Signed)
Patient states that she still has a deep cough, congestion, dyspnea, and fever.  Appt made patient requesting Xray

## 2017-04-27 NOTE — Progress Notes (Signed)
BP 123/82   Pulse 78   Temp 99 F (37.2 C) (Oral)   Ht 5' 10" (1.778 m)   Wt 275 lb (124.7 kg)   BMI 39.46 kg/m    Subjective:    Patient ID: Laura Hansen, female    DOB: December 08, 1975, 42 y.o.   MRN: [id number]  HPI: Laura Hansen is a 42 y.o. female presenting on 04/27/2017 for Cough, burning in chest and back, fever at night (ongoing since the middle of February, third time being seen)   HPI Pt presents with nonproductive cough and congestion, and burning in chest that has been going on since 2/20 when she was diagnosed with flu. She stated that she was given tamiflu and started to feel better but then came back to the office on 2/25 for ear infection and cough. She was given zpak, tessalon pearls, hycodan and symbicort. She has also been using albuterol and mucinex. None of the above has been helping her cough or symptoms. She has been sleeping upright in a chair because her cough has been so bad. She also c/o fatigue, SOB, and low grade fever 47F. She has a history of asthma from childhood and states that she gets bronchitis once/year for which she uses an albuterol inhaler. Her husband smokes cigarettes.   Relevant past medical, surgical, family and social history reviewed and updated as indicated. Interim medical history since our last visit reviewed. Allergies and medications reviewed and updated.  Review of Systems  Constitutional: Positive for fatigue and fever. Negative for appetite change and chills.  HENT: Negative for congestion, ear pain, postnasal drip, rhinorrhea, sinus pressure, sinus pain and sore throat.   Eyes: Negative for pain and discharge.  Respiratory: Positive for cough, chest tightness and shortness of breath. Negative for wheezing.   Cardiovascular: Negative for chest pain and leg swelling.  Gastrointestinal: Negative for abdominal pain, constipation, diarrhea, nausea and vomiting.  Genitourinary: Negative for difficulty urinating, flank pain and  frequency.  Skin: Negative for pallor and rash.  Neurological: Negative for numbness and headaches.    Per HPI unless specifically indicated above   Allergies as of 04/27/2017      Reactions   Peanut-containing Drug Products Anaphylaxis   All nuts   Amoxicillin    Codeine       Medication List        Accurate as of 04/27/17  4:34 PM. Always use your most recent med list.          benzonatate 200 MG capsule Commonly known as:  TESSALON Take 1 capsule (200 mg total) by mouth 3 (three) times daily as needed.   cefdinir 300 MG capsule Commonly known as:  OMNICEF Take 1 capsule (300 mg total) by mouth 2 (two) times daily. 1 po BID   EPINEPHrine 0.3 mg/0.3 mL Soaj injection Commonly known as:  EPI-PEN Inject 0.3 mLs (0.3 mg total) into the muscle once.   HYDROcodone-homatropine 5-1.5 MG/5ML syrup Commonly known as:  HYCODAN Take 5 mLs by mouth every 6 (six) hours as needed for cough.   ondansetron 4 MG tablet Commonly known as:  ZOFRAN Take 1 tablet (4 mg total) by mouth every 8 (eight) hours as needed for nausea or vomiting.   pantoprazole 40 MG tablet Commonly known as:  PROTONIX Take 1 tablet (40 mg total) by mouth daily.          Objective:    BP 123/82   Pulse 78   Temp 99 F (37.2  C) (Oral)   Ht 5' 10" (1.778 m)   Wt 275 lb (124.7 kg)   BMI 39.46 kg/m   Wt Readings from Last 3 Encounters:  04/27/17 275 lb (124.7 kg)  04/16/17 277 lb (125.6 kg)  04/08/17 278 lb 9.6 oz (126.4 kg)    Physical Exam  Constitutional: She is oriented to person, place, and time. She appears well-developed and well-nourished.  HENT:  Right Ear: Hearing, tympanic membrane, external ear and ear canal normal.  Left Ear: Hearing, tympanic membrane, external ear and ear canal normal.  Nose: Nose normal.  Mouth/Throat: Oropharynx is clear and moist and mucous membranes are normal.  Cardiovascular: Normal rate, regular rhythm and normal heart sounds.  Pulmonary/Chest: Effort  normal and breath sounds normal. She has no wheezes. She has no rhonchi. She has no rales. She exhibits tenderness (Lower rib tenderness with deep inspiration is worse as well as upon palpation). She exhibits no bony tenderness, no laceration and no swelling.  Abdominal: There is no CVA tenderness.  Lymphadenopathy:    She has no cervical adenopathy.  Neurological: She is alert and oriented to person, place, and time.  Skin: Skin is warm, dry and intact.  Nursing note and vitals reviewed.    XR: impression is negative for abnormalities including consolidation or pneumonia.    Assessment & Plan:   Problem List Items Addressed This Visit    None    Visit Diagnoses    Mild persistent asthma with exacerbation    -  Primary   Relevant Medications   methylPREDNISolone acetate (DEPO-MEDROL) injection 80 mg (Completed)   cefdinir (OMNICEF) 300 MG capsule   Other Relevant Orders   DG Chest 2 View   CBC with Differential/Platelet   BMP8+EGFR   Bronchitis       Relevant Medications   methylPREDNISolone acetate (DEPO-MEDROL) injection 80 mg (Completed)   cefdinir (OMNICEF) 300 MG capsule   Other Relevant Orders   DG Chest 2 View   CBC with Differential/Platelet   BMP8+EGFR      Will order Depo-Medrol injection and Cefdinir. Instruct patient to continue inhalers (Albuterol and Symbicort) and start OTC allergy medication. Also continue fluids and rest. Return if symptoms worsen.  Follow up plan: Return if symptoms worsen or fail to improve.  Counseling provided for all of the vaccine components Orders Placed This Encounter  Procedures  . DG Chest 2 View   Patient seen and examined with Chaney Malling PA student, agree with assessment and plan above. Caryl Pina, MD [facility] 05/03/2017, 9:46 PM

## 2017-04-28 LAB — CBC WITH DIFFERENTIAL/PLATELET
BASOS ABS: 0 10*3/uL (ref 0.0–0.2)
Basos: 0 %
EOS (ABSOLUTE): 0.4 10*3/uL (ref 0.0–0.4)
Eos: 3 %
HEMOGLOBIN: 13.5 g/dL (ref 11.1–15.9)
Hematocrit: 40.3 % (ref 34.0–46.6)
Immature Grans (Abs): 0 10*3/uL (ref 0.0–0.1)
Immature Granulocytes: 0 %
LYMPHS ABS: 2.7 10*3/uL (ref 0.7–3.1)
Lymphs: 22 %
MCH: 29.8 pg (ref 26.6–33.0)
MCHC: 33.5 g/dL (ref 31.5–35.7)
MCV: 89 fL (ref 79–97)
MONOCYTES: 8 %
MONOS ABS: 1 10*3/uL — AB (ref 0.1–0.9)
NEUTROS PCT: 67 %
Neutrophils Absolute: 8.4 10*3/uL — ABNORMAL HIGH (ref 1.4–7.0)
Platelets: 287 10*3/uL (ref 150–379)
RBC: 4.53 x10E6/uL (ref 3.77–5.28)
RDW: 13.3 % (ref 12.3–15.4)
WBC: 12.7 10*3/uL — AB (ref 3.4–10.8)

## 2017-04-28 LAB — BMP8+EGFR
BUN/Creatinine Ratio: 21 (ref 9–23)
BUN: 13 mg/dL (ref 6–24)
CO2: 23 mmol/L (ref 20–29)
Calcium: 9.5 mg/dL (ref 8.7–10.2)
Chloride: 104 mmol/L (ref 96–106)
Creatinine, Ser: 0.62 mg/dL (ref 0.57–1.00)
GFR calc Af Amer: 129 mL/min/{1.73_m2} (ref >59)
GFR calc non Af Amer: 112 mL/min/{1.73_m2} (ref >59)
GLUCOSE: 95 mg/dL (ref 65–99)
POTASSIUM: 4.6 mmol/L (ref 3.5–5.2)
SODIUM: 140 mmol/L (ref 134–144)

## 2017-05-02 ENCOUNTER — Telehealth: Payer: Self-pay | Admitting: Family

## 2017-05-02 ENCOUNTER — Other Ambulatory Visit: Payer: Self-pay | Admitting: Family Medicine

## 2017-05-02 MED ORDER — FLUCONAZOLE 150 MG PO TABS: 150.0000 mg | ORAL_TABLET | Freq: Once | ORAL | 0 refills | Status: AC

## 2017-05-02 NOTE — Telephone Encounter (Signed)
Please address

## 2017-05-02 NOTE — Telephone Encounter (Signed)
I sent in the requested prescription 

## 2017-05-02 NOTE — Telephone Encounter (Signed)
Patient notified

## 2017-07-15 DIAGNOSIS — Z6841 Body Mass Index (BMI) 40.0 and over, adult: Secondary | ICD-10-CM | POA: Diagnosis not present

## 2017-07-15 DIAGNOSIS — Z01419 Encounter for gynecological examination (general) (routine) without abnormal findings: Secondary | ICD-10-CM | POA: Diagnosis not present

## 2017-08-27 ENCOUNTER — Encounter: Payer: Self-pay | Admitting: Family

## 2017-08-27 ENCOUNTER — Ambulatory Visit: Payer: 59 | Admitting: Family

## 2017-08-27 ENCOUNTER — Ambulatory Visit (INDEPENDENT_AMBULATORY_CARE_PROVIDER_SITE_OTHER): Payer: 59

## 2017-08-27 VITALS — BP 123/78 | HR 68 | Temp 98.3°F | Ht 70.0 in | Wt 284.4 lb

## 2017-08-27 DIAGNOSIS — R109 Unspecified abdominal pain: Secondary | ICD-10-CM | POA: Diagnosis not present

## 2017-08-27 DIAGNOSIS — Z87442 Personal history of urinary calculi: Secondary | ICD-10-CM

## 2017-08-27 DIAGNOSIS — R1012 Left upper quadrant pain: Secondary | ICD-10-CM | POA: Diagnosis not present

## 2017-08-27 DIAGNOSIS — R110 Nausea: Secondary | ICD-10-CM

## 2017-08-27 LAB — URINALYSIS, COMPLETE
Bilirubin, UA: NEGATIVE
Glucose, UA: NEGATIVE
Ketones, UA: NEGATIVE
LEUKOCYTES UA: NEGATIVE
Nitrite, UA: NEGATIVE
PH UA: 6.5 (ref 5.0–7.5)
PROTEIN UA: NEGATIVE
Urobilinogen, Ur: 0.2 mg/dL (ref 0.2–1.0)

## 2017-08-27 LAB — CBC WITH DIFFERENTIAL/PLATELET
BASOS: 0 %
Basophils Absolute: 0 10*3/uL (ref 0.0–0.2)
EOS (ABSOLUTE): 0.3 10*3/uL (ref 0.0–0.4)
Eos: 3 %
Hematocrit: 39.8 % (ref 34.0–46.6)
Hemoglobin: 13.3 g/dL (ref 11.1–15.9)
Immature Grans (Abs): 0 10*3/uL (ref 0.0–0.1)
Immature Granulocytes: 0 %
LYMPHS ABS: 2.2 10*3/uL (ref 0.7–3.1)
Lymphs: 23 %
MCH: 30.5 pg (ref 26.6–33.0)
MCHC: 33.4 g/dL (ref 31.5–35.7)
MCV: 91 fL (ref 79–97)
MONOS ABS: 0.8 10*3/uL (ref 0.1–0.9)
Monocytes: 9 %
NEUTROS ABS: 6.3 10*3/uL (ref 1.4–7.0)
Neutrophils: 65 %
PLATELETS: 270 10*3/uL (ref 150–450)
RBC: 4.36 x10E6/uL (ref 3.77–5.28)
RDW: 13 % (ref 12.3–15.4)
WBC: 9.7 10*3/uL (ref 3.4–10.8)

## 2017-08-27 LAB — CMP14+EGFR
A/G RATIO: 1.5 (ref 1.2–2.2)
ALT: 18 IU/L (ref 0–32)
AST: 17 IU/L (ref 0–40)
Albumin: 4.2 g/dL (ref 3.5–5.5)
Alkaline Phosphatase: 76 IU/L (ref 39–117)
BILIRUBIN TOTAL: 0.8 mg/dL (ref 0.0–1.2)
BUN / CREAT RATIO: 10 (ref 9–23)
BUN: 7 mg/dL (ref 6–24)
CALCIUM: 9.2 mg/dL (ref 8.7–10.2)
CO2: 19 mmol/L — ABNORMAL LOW (ref 20–29)
Chloride: 103 mmol/L (ref 96–106)
Creatinine, Ser: 0.7 mg/dL (ref 0.57–1.00)
GFR, EST AFRICAN AMERICAN: 124 mL/min/{1.73_m2} (ref >59)
GFR, EST NON AFRICAN AMERICAN: 107 mL/min/{1.73_m2} (ref >59)
GLOBULIN, TOTAL: 2.8 g/dL (ref 1.5–4.5)
Glucose: 92 mg/dL (ref 65–99)
POTASSIUM: 4.4 mmol/L (ref 3.5–5.2)
SODIUM: 139 mmol/L (ref 134–144)
TOTAL PROTEIN: 7 g/dL (ref 6.0–8.5)

## 2017-08-27 LAB — MICROSCOPIC EXAMINATION: Renal Epithel, UA: NONE SEEN /hpf

## 2017-08-27 NOTE — Patient Instructions (Signed)

## 2017-08-27 NOTE — Progress Notes (Signed)
Subjective:    Patient ID: Laura Hansen, female    DOB: 19-May-1975, 42 y.o.   MRN: [id number]  Chief Complaint  Patient presents with  . left side pain, nausea    began yesterday    Flank Pain  This is a new problem. The current episode started yesterday. The problem occurs intermittently. The problem has been waxing and waning since onset. The quality of the pain is described as aching. The pain does not radiate. The pain is at a severity of 9/10. The pain is moderate. Associated symptoms include abdominal pain. Pertinent negatives include no bladder incontinence, bowel incontinence, dysuria, fever, headaches or leg pain. She has tried bed rest for the symptoms. The treatment provided mild relief.  Abdominal Pain  The pain is located in the left flank and LUQ. Associated symptoms include nausea. Pertinent negatives include no belching, constipation, dysuria, fever, frequency, headaches, hematochezia, hematuria or vomiting.      Review of Systems  Constitutional: Negative for fever.  Gastrointestinal: Positive for abdominal pain and nausea. Negative for bowel incontinence, constipation, hematochezia and vomiting.  Genitourinary: Positive for flank pain. Negative for bladder incontinence, dysuria, frequency and hematuria.  Neurological: Negative for headaches.  All other systems reviewed and are negative.      Objective:   Physical Exam  Constitutional: She is oriented to person, place, and time. She appears well-developed and well-nourished. No distress.  HENT:  Head: Normocephalic.  Eyes: Pupils are equal, round, and reactive to light.  Neck: Normal range of motion. Neck supple. No thyromegaly present.  Cardiovascular: Normal rate, regular rhythm, normal heart sounds and intact distal pulses.  No murmur heard. Pulmonary/Chest: Effort normal and breath sounds normal. No respiratory distress. She has no wheezes.  Abdominal: Soft. Bowel sounds are normal. She exhibits no  distension. There is no tenderness.  Musculoskeletal: Normal range of motion. She exhibits tenderness (mild left flank pain). She exhibits no edema.  Neurological: She is alert and oriented to person, place, and time. She has normal reflexes. No cranial nerve deficit.  Skin: Skin is warm and dry.  Psychiatric: She has a normal mood and affect. Her behavior is normal. Judgment and thought content normal.  Vitals reviewed.   BP 123/78   Pulse 68   Temp 98.3 F (36.8 C) (Oral)   Ht _0  (1.778 m)   Wt 284 lb 6.4 oz (129 kg)   BMI 40.81 kg/m      Assessment & Plan:  Laura Hansen was seen today for left side pain, nausea.  Diagnoses and all orders for this visit:  Left flank pain -     Urinalysis, Complete -     Urine Culture -     CMP14+EGFR -     CBC with Differential/Platelet -     DG Abd 1 View; Future  History of kidney stones -     CMP14+EGFR -     CBC with Differential/Platelet -     DG Abd 1 View; Future  Nausea -     CMP14+EGFR -     CBC with Differential/Platelet -     DG Abd 1 View; Future  LUQ pain -     CMP14+EGFR -     CBC with Differential/Platelet -     DG Abd 1 View; Future   I believe this is related to a stone with her hx.  Force fluids Pt declines any pain medication at this time Labs pending to rule out any infection  If  pain worsens or does not improve call office. If unable to void or changes in amount of output let us know!!!  [name], FNP

## 2017-08-28 LAB — URINE CULTURE

## 2017-09-24 ENCOUNTER — Other Ambulatory Visit: Payer: Self-pay | Admitting: Obstetrics & Gynecology

## 2017-09-24 DIAGNOSIS — Z1231 Encounter for screening mammogram for malignant neoplasm of breast: Secondary | ICD-10-CM

## 2017-10-06 IMAGING — NM NM HEPATOBILIARY IMAGE, INC GB
3 series · 18 of 18 positions shown · non-contrast
Comparison: 06/28/2015

CLINICAL DATA: Nausea and vomiting, and abdominal pain for 1 month
in the right upper quadrant.

EXAM:
NUCLEAR MEDICINE HEPATOBILIARY IMAGING WITH GALLBLADDER EF
TECHNIQUE: Sequential images of the abdomen were obtained [DATE] minutes
following intravenous administration of radiopharmaceutical. After
oral ingestion of Ensure, gallbladder ejection fraction was
determined. At 60 min, normal ejection fraction is greater than 33%.
RADIOPHARMACEUTICALS:  5.4 MCi 9c-DDm  Choletec IV

[Series 1: biliary · 3.25mm/px · 6 of 60 frames shown]
[frame 6/60]
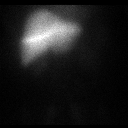
[frame 16/60]
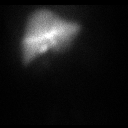
[frame 26/60]
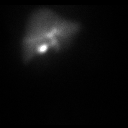
[frame 36/60]
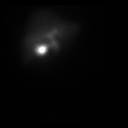
[frame 46/60]
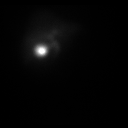
[frame 56/60]
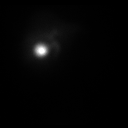

[Series 1: dynamic - (id) - gbef - hida_motion_corrected moti · 3.25mm/px · 6 of 60 frames shown]
[frame 6/60]
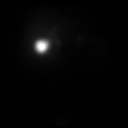
[frame 16/60]
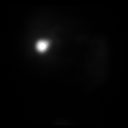
[frame 26/60]
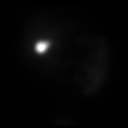
[frame 36/60]
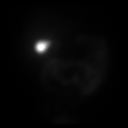
[frame 46/60]
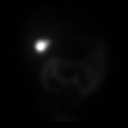
[frame 56/60]
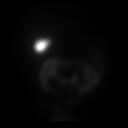

[Series 2: gbef · 3.25mm/px · 6 of 60 frames shown]
[frame 6/60]
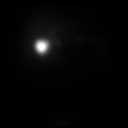
[frame 16/60]
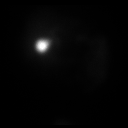
[frame 26/60]
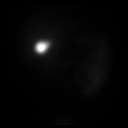
[frame 36/60]
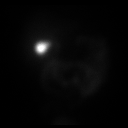
[frame 46/60]
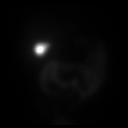
[frame 56/60]
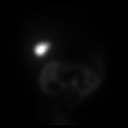

[18 of 18 positions shown; findings below may reference images not displayed]

FINDINGS: Satisfactory uptake of radiopharmaceutical from the blood pool.
Biliary activity and gallbladder activity both visible at 19
minutes. Bowel activity go visible after the Ensure administration.

The patient did not experience symptoms with the Ensure medial.
Gallbladder ejection fraction is calculated at 35% over a 1 hour
observation.

(Normal gallbladder ejection fraction with Ensure is greater than
33%.)
IMPRESSION: 1. Sluggish but within normal limits gallbladder ejection, 35% at 1
hour (normally range is greater than 33%). The remainder of the exam
is likewise unremarkable.

## 2017-10-21 ENCOUNTER — Ambulatory Visit
Admission: RE | Admit: 2017-10-21 | Discharge: 2017-10-21 | Disposition: A | Discharge status: Home / Self Care | Payer: 59 | Source: Ambulatory Visit | Attending: Obstetrics & Gynecology | Admitting: Obstetrics & Gynecology

## 2017-10-21 DIAGNOSIS — Z1231 Encounter for screening mammogram for malignant neoplasm of breast: Secondary | ICD-10-CM | POA: Diagnosis not present

## 2018-02-02 ENCOUNTER — Encounter: Payer: Self-pay | Admitting: Family

## 2018-02-02 ENCOUNTER — Ambulatory Visit: Payer: 59 | Admitting: Family

## 2018-02-02 ENCOUNTER — Ambulatory Visit: Payer: 59 | Admitting: Family Medicine

## 2018-02-02 DIAGNOSIS — T7840XA Allergy, unspecified, initial encounter: Secondary | ICD-10-CM | POA: Diagnosis not present

## 2018-02-02 MED ORDER — EPINEPHRINE 0.3 MG/0.3ML IJ SOAJ: 0.3000 mg | Freq: Once | INTRAMUSCULAR | 0 refills | Status: AC

## 2018-02-02 MED ORDER — METHYLPREDNISOLONE ACETATE 80 MG/ML IJ SUSP
80.0000 mg | Freq: Once | INTRAMUSCULAR | Status: AC
  Administered 2018-02-02: 80 mg via INTRAMUSCULAR

## 2018-02-02 NOTE — Progress Notes (Signed)
   Subjective:    Patient ID: Laura Hansen, female    DOB: 14-Feb-1976, 42 y.o.   MRN: [id number]  Chief Complaint  Patient presents with  . Allergic Reaction    started yesterday    Allergic Reaction  This is a new problem. The current episode started yesterday. The problem occurs constantly. The problem has been gradually worsening since onset. The problem is moderate. It is unknown what she was exposed to. Pertinent negatives include no chest pressure, coughing or difficulty breathing. (Left eye swelling, hives on neck, face, and chest) Swelling is present on the eyes. Past treatments include rest and diphenhydramine. The treatment provided mild relief.      Review of Systems  Respiratory: Negative for cough.   All other systems reviewed and are negative.      Objective:   Physical Exam Vitals signs reviewed.  Constitutional:      General: She is not in acute distress.    Appearance: She is well-developed.  Eyes:     Comments: Left eye lid moderate sweling  Neck:     Musculoskeletal: Normal range of motion and neck supple.     Thyroid: No thyromegaly.  Cardiovascular:     Rate and Rhythm: Normal rate and regular rhythm.     Heart sounds: Normal heart sounds. No murmur.  Pulmonary:     Effort: Pulmonary effort is normal. No respiratory distress.     Breath sounds: Normal breath sounds. No wheezing.  Abdominal:     General: Bowel sounds are normal. There is no distension.     Palpations: Abdomen is soft.     Tenderness: There is no abdominal tenderness.  Musculoskeletal: Normal range of motion.        General: No tenderness.  Skin:    General: Skin is warm and dry.     Findings: Rash present. Rash is urticarial (neck, face, cheeks, and chest).  Neurological:     Mental Status: She is alert and oriented to person, place, and time.     Cranial Nerves: No cranial nerve deficit.     Deep Tendon Reflexes: Reflexes are normal and symmetric.  Psychiatric:      Behavior: Behavior normal.        Thought Content: Thought content normal.        Judgment: Judgment normal.       BP 121/79   Pulse 73   Temp (!) 97.5 F (36.4 C) (Oral)   Ht 5\' 10"  (1.778 m)   Wt 279 lb 6.4 oz (126.7 kg)   BMI 40.09 kg/m      Assessment & Plan:  ELLINGTON CORNIA comes in today with chief complaint of Allergic Reaction (started yesterday)   Diagnosis and orders addressed:  1. Allergic reaction, initial encounter Avoid beef, pork, and lamb until lab work checked Will refill Epipen Continue to take Benadryl every 6 hours, if having any swelling of tongue, throat, or SOB use Epipen  RTO if symptoms do not improve or worsen - EPINEPHrine 0.3 mg/0.3 mL IJ SOAJ injection; Inject 0.3 mLs (0.3 mg total) into the muscle once for 1 dose.  Dispense: 1 Device; Refill: 0 - methylPREDNISolone acetate (DEPO-MEDROL) injection 80 mg - Alpha-Gal Panel   [name], FNP

## 2018-02-02 NOTE — Patient Instructions (Signed)
Anaphylactic Reaction, Adult An anaphylactic reaction (anaphylaxis) is a sudden, severe allergic reaction that affects multiple areas of the body. Affected areas of the body may include the skin, mouth, lungs, heart, or gut (digestive system). Anaphylaxis can be life-threatening. This condition requires immediate medical attention, and sometimes hospitalization. What are the causes? This condition is caused by exposure to a substance that you are allergic to (allergen). In response to this exposure, the body releases proteins (antibodies) and other compounds, such as histamine, into the bloodstream. This causes swelling in certain tissues and loss of blood pressure to important areas, such as the heart and lungs. Common allergens that can cause anaphylaxis include:  Medicines.  Foods, especially peanuts, wheat, shellfish, milk, and eggs.  Insect bites or stings.  Blood or parts of blood, including plasma, immunoglobulins, or serum.  Chemicals, such as dyes, latex, and contrast material that is used for medical tests.  What increases the risk? This condition is more likely to occur in people who:  Have allergies.  Have had anaphylaxis before.  Have a family history of anaphylaxis.  Have certain medical conditions, including asthma and eczema.  What are the signs or symptoms? Symptoms of anaphylaxis include:  Nasal congestion.  Headache.  Flushed face.  Tingling in the mouth.  An itchy, red rash.  Swelling of the eyes, lips, face, or tongue.  Swelling of the back of the mouth and the throat.  Wheezing.  A hoarse voice.  Itchy, red, swollen areas of skin (hives).  Dizziness or light-headedness.  Fainting.  Anxiety or confusion.  Abdominal or chest pain.  Difficulty breathing, speaking, or swallowing.  Chest or throat tightness.  Fast or irregular heartbeats (palpitations).  Vomiting.  Diarrhea.  How is this diagnosed? This condition is diagnosed based  on a physical exam and your history of recent exposure to allergens. You may be referred for follow-up testing by a health care provider who specializes in allergies. This testing can confirm the diagnosis and determine which substances you are allergic to. Testing may include:  Skin tests. These may involve: ? Injecting a small amount of the possible allergen between layers of your skin (intradermal injection). ? Applying patches to your skin.  Blood tests.  How is this treated? Emergency treatment may include:  Medicines that help: ? To relieve itching and hives (antihistamines). ? To reduce swelling (corticosteroids). ? To tighten your blood vessels and increase your heart rate (epinephrine).  Oxygen therapy to help you breathe.  Giving fluids through an IV tube.  Your health care provider may teach you how to use an anaphylaxis kit and how to give yourself an epinephrine injection with what is commonly called an auto-injector "pen" (pre-filled automatic epinephrine injection device). If you think that you are having an anaphylactic reaction, you should use an auto-injector pen or an anaphylaxis kit. If you use epinephrine, you must still seek emergency medical treatment. Follow these instructions at home: Safety  Always keep an auto-injector pen or an anaphylaxis kit near you. These can be lifesaving if you have a severe anaphylactic reaction. Use your auto-injector pen or anaphylaxis kit as told by your health care provider.  Do not drive until your health care provider approves.  Make sure that you, the members of your household, and your employer know: ? How to use an anaphylaxis kit. ? How to use an auto-injector pen to give you an epinephrine injection.  Replace your epinephrine immediately after you use your auto-injector pen, in case you have  another reaction.  Wear a medical alert bracelet or necklace that states your allergy, if told by your health care  provider.  Learn the signs of anaphylaxis.  Work with your health care providers to come up with an anaphylaxis plan. Preparation is important. General instructions  Take over-the-counter and prescription medicines only as told by your health care provider.  If you have hives or a rash: ? Use an over-the-counter antihistamine as told by your health care provider. ? Apply cold, wet cloths (cold compresses) to your skin or take baths or showers in cool water. Avoid hot water.  If you had tests done, it is your responsibility to get your test results. Ask your health care provider or the department performing the tests when your results will be ready.  Tell any health care providers who care for you that you have an allergy.  Keep all follow-up visits as told by your health care provider. This is important. How is this prevented?  Avoid allergens that have caused an anaphylactic reaction for you before.  When you are at a restaurant, tell your server that you have an allergy. If you are unsure whether a meal has an ingredient that you are allergic to, ask your server. Contact a health care provider if:  You develop symptoms of an allergic reaction. You may notice them soon after you are exposed to a substance. The symptoms may include: ? Rash. ? Headache. ? Sneezing or a runny nose. ? Swelling. ? Nausea. ? Diarrhea. Get help right away if:  You needed to use epinephrine. ? An epinephrine injection helps to manage life-threatening allergic reactions, but you still need to go to the emergency room even if epinephrine seems to work. This is important because anaphylaxis may happen again within 72 hours (rebound anaphylaxis). ? If you used epinephrine to treat anaphylaxis outside of the hospital, you need additional medical care. This may include more doses of epinephrine.  You develop: ? A tight feeling in your chest or throat. ? Wheezing or difficulty breathing. ? Hives. ? Red  skin or itching all over your body. ? Swelling in your lips, tongue, or the back of your throat.  You have severe vomiting or diarrhea.  You faint or you feel like you are going to faint. These symptoms may represent a serious problem that is an emergency. Do not wait to see if the symptoms will go away. Use your auto-injector pen or anaphylaxis kit as you have been instructed, and get medical help right away. Call your local emergency services (911 in the [place] Do not drive yourself to the hospital. This information is not intended to replace advice given to you by your health care provider. Make sure you discuss any questions you have with your health care provider. Document Released: 02/03/2005 Document Revised: 10/02/2015 Document Reviewed: 08/20/2015 Elsevier Interactive Patient Education  2018 [employer]  

## 2018-02-03 ENCOUNTER — Ambulatory Visit: Payer: 59 | Admitting: Family

## 2018-02-03 ENCOUNTER — Encounter: Payer: Self-pay | Admitting: Family

## 2018-02-03 VITALS — BP 128/81 | HR 79 | Temp 98.4°F | Ht 70.0 in | Wt 274.4 lb

## 2018-02-03 DIAGNOSIS — R21 Rash and other nonspecific skin eruption: Secondary | ICD-10-CM

## 2018-02-03 DIAGNOSIS — T7840XD Allergy, unspecified, subsequent encounter: Secondary | ICD-10-CM | POA: Diagnosis not present

## 2018-02-03 MED ORDER — TRIAMCINOLONE ACETONIDE 0.5 % EX OINT: 1.0000 "application " | TOPICAL_OINTMENT | Freq: Two times a day (BID) | CUTANEOUS | 0 refills | Status: AC

## 2018-02-03 MED ORDER — BETAMETHASONE SOD PHOS & ACET 30 MG/5ML IJ KIT: 6.0000 mg | PACK | Freq: Once | INTRAMUSCULAR | Status: DC

## 2018-02-03 MED ORDER — BETAMETHASONE SOD PHOS & ACET 6 (3-3) MG/ML IJ SUSP
6.0000 mg | Freq: Once | INTRAMUSCULAR | Status: AC
  Administered 2018-02-03: 6 mg via INTRAMUSCULAR

## 2018-02-03 MED ORDER — PREDNISONE 10 MG (21) PO TBPK: ORAL_TABLET | ORAL | 0 refills | Status: DC

## 2018-02-03 NOTE — Patient Instructions (Signed)
Anaphylactic Reaction, Adult An anaphylactic reaction (anaphylaxis) is a sudden, severe allergic reaction by the body's disease-fighting system (immune system). Anaphylaxis can be life-threatening. This condition must be treated right away. Sometimes a person may need to be treated in the hospital. What are the causes? This condition is caused by exposure to a substance that you are allergic to (allergen). In response to this exposure, the body releases proteins (antibodies) and other compounds, such as histamine, into the bloodstream. This causes swelling in certain tissues and loss of blood pressure to important areas, such as the heart and lungs. Common allergens that can cause anaphylaxis include:  Foods, especially peanuts, wheat, shellfish, milk, and eggs.  Medicines.  Insect bites or stings.  Blood or parts of blood received for treatment (transfusions).  Chemicals, such as dyes, latex, and contrast material that is used for medical tests. What increases the risk? This condition is more likely to occur in people who:  Have allergies.  Have had anaphylaxis before.  Have a family history of anaphylaxis.  Have certain medical conditions, including asthma and eczema. What are the signs or symptoms? Symptoms of anaphylaxis may include:  Feeling warm in the face (flushed). This may include redness.  Itchy, red, swollen areas of skin (hives).  Swelling of the eyes, lips, face, mouth, tongue, or throat.  Difficulty breathing, speaking, or swallowing.  Noisy breathing (wheezing).  Dizziness or light-headedness.  Fainting.  Pain or cramping in the abdomen.  Vomiting.  Diarrhea. How is this diagnosed? This condition is diagnosed based on:  Your symptoms.  A physical exam.  Blood tests.  Recent history of exposure to allergens. How is this treated? If you think you are having an anaphylactic reaction, you should do the following right away:  Give yourself an  epinephrine injection using what is commonly called an auto-injector "pen" (pre-filled automatic epinephrine injection device). Your health care provider will teach you how to use an auto-injector pen.  Call for emergency help. If you use a pen, you must still get emergency medical treatment in the hospital. Treatment in the hospital may include: ? Medicines to help:  Tighten your blood vessels (epinephrine).  Relieve itching and hives (antihistamines).  Reduce swelling (corticosteroids). ? Oxygen therapy to help you breathe. ? IV fluids to keep you hydrated. Follow these instructions at home: Safety  Always keep an auto-injector pen near you. This can be lifesaving if you have a severe anaphylactic reaction. Use your auto-injector pen as told by your health care provider.  Do not drive after an anaphylactic reaction until your health care provider approves.  Make sure that you, the members of your household, and your employer know: ? What you are allergic to, so it can be avoided. ? How to use an auto-injector pen to give you an epinephrine injection.  Replace your epinephrine immediately after you use your auto-injector pen. This is important if you have another reaction.  If told by your health care provider, wear a medical alert bracelet or necklace that states your allergy.  Learn the signs of anaphylaxis so that you can recognize and treat it right away.  Work with your health care providers to make an anaphylaxis plan. Preparation is important. General instructions  If you have hives or rash: ? Use an over-the-counter antihistamine as told by your health care provider. ? Apply cold, wet cloths (cold compresses) to your skin or take baths or showers in cool water. Avoid hot water.  Take over-the-counter and prescription medicines only   anaphylaxis plan. Preparation is important.  General instructions  · If you have hives or rash:  ? Use an over-the-counter antihistamine as told by your health care provider.  ? Apply cold, wet cloths (cold compresses) to your skin or take baths or showers in cool water. Avoid hot water.  · Take over-the-counter and prescription medicines only as told by your health care provider.  · Tell all your health care providers that you have an allergy.  · Keep all follow-up visits as told by  your health care provider. This is important.  How is this prevented?  · Avoid allergens that have caused an anaphylactic reaction in the past.  · When you are at a restaurant, tell your server that you have an allergy. If you are not sure whether a menu item contains an ingredient that you are allergic to, ask your server.  Where to find more information  · American Academy of Allergy, Asthma and Immunology: aaaai.org  · American Academy of Pediatrics: healthychildren.org  Get help right away if:  · You develop symptoms of an allergic reaction. You may notice them soon after you are exposed to a substance. Symptoms may include:  ? Flushed skin.  ? Hives.  ? Swelling of the eyes, lips, face, mouth, tongue, or throat.  ? Difficulty breathing, speaking, or swallowing.  ? Wheezing.  ? Dizziness or light-headedness.  ? Fainting.  ? Pain or cramping in the abdomen.  ? Vomiting.  ? Diarrhea.  · You used epinephrine. You need more medical care even if the medicine seems to be working. This is important because anaphylaxis may happen again within 72 hours (rebound anaphylaxis). You may need more doses of epinephrine.  These symptoms may represent a serious problem that is an emergency. Do not wait to see if the symptoms will go away. Do the following right away:  · Use the auto-injector pen as you have been instructed.  · Get medical help. Call your local emergency services (911 in the [place] Do not drive yourself to the hospital.  Summary  · An anaphylactic reaction (anaphylaxis) is a sudden, severe allergic reaction by the body's disease-fighting system (immune system).  · This condition can be life-threatening. If you have an anaphylactic reaction, get medical help right away.  · Your health care provider may teach you how to use an auto-injector "pen" (pre-filled automatic epinephrine injection device) to give yourself a shot.  · Always keep an auto-injector pen with you. This could save your life. Use it as told by  your health care provider.  · If you use epinephrine, you must still get emergency medical treatment, even if the medicine seems to be working.  This information is not intended to replace advice given to you by your health care provider. Make sure you discuss any questions you have with your health care provider.  Document Released: 02/03/2005 Document Revised: 05/28/2017 Document Reviewed: 05/28/2017  Elsevier Interactive Patient Education © 2019 [employer]

## 2018-02-03 NOTE — Progress Notes (Signed)
   Subjective:    Patient ID: Laura Hansen, female    DOB: 10-20-1975, 42 y.o.   MRN: [id number]  Chief Complaint  Patient presents with  . Allergic Reaction    worsening    Allergic Reaction  This is a new problem. The current episode started 2 days ago. The problem occurs constantly. The problem has been gradually worsening since onset. The problem is moderate. Associated symptoms include itching. Pertinent negatives include no difficulty breathing, eye itching, eye redness or trouble swallowing. (Chest tightness, left eye swelling, hives neck, chest, arms, and back) Past treatments include diphenhydramine (depo medrol ). The treatment provided mild relief.      Review of Systems  HENT: Negative for trouble swallowing.   Eyes: Negative for redness and itching.  Skin: Positive for itching.  All other systems reviewed and are negative.      Objective:   Physical Exam Vitals signs reviewed.  Constitutional:      General: She is not in acute distress.    Appearance: She is well-developed.  Eyes:     Pupils: Pupils are equal, round, and reactive to light.     Comments: Left eye lid moderate swelling  Neck:     Musculoskeletal: Normal range of motion and neck supple.     Thyroid: No thyromegaly.  Cardiovascular:     Rate and Rhythm: Normal rate and regular rhythm.     Heart sounds: Normal heart sounds. No murmur.  Pulmonary:     Effort: Pulmonary effort is normal. No respiratory distress.     Breath sounds: Normal breath sounds. No wheezing.  Abdominal:     General: Bowel sounds are normal. There is no distension.     Palpations: Abdomen is soft.     Tenderness: There is no abdominal tenderness.  Musculoskeletal: Normal range of motion.        General: No tenderness.  Skin:    General: Skin is warm and dry.     Findings: Rash present. Rash is urticarial (neck, arms, chest, back).  Neurological:     Mental Status: She is alert and oriented to person, place, and time.      Cranial Nerves: No cranial nerve deficit.     Deep Tendon Reflexes: Reflexes are normal and symmetric.  Psychiatric:        Behavior: Behavior normal.        Thought Content: Thought content normal.        Judgment: Judgment normal.     BP 128/81   Pulse 79   Temp 98.4 F (36.9 C) (Oral)   Ht 5\' 10"  (1.778 m)   Wt 274 lb 6.4 oz (124.5 kg)   BMI 39.37 kg/m      Assessment & Plan:  HYDIE LANGAN comes in today with chief complaint of Allergic Reaction (worsening)   Diagnosis and orders addressed:  1. Allergic reaction, subsequent encounter We will give celestone injection and tamper steroid dose She has an Epipen at home and will use if she starts having any oral swelling, SOB Continue Allegra and benadryl  Start Pepcid  Cool compresses RTO if symptoms do not resolve in the next 24 hours Do not scratch or itch - predniSONE (STERAPRED UNI-PAK 21 TAB) 10 MG (21) TBPK tablet; Use as directed  Dispense: 21 tablet; Refill: 0 - betamethasone acetate-betamethasone sodium phosphate (CELESTONE) injection 6 mg   [name], FNP

## 2018-02-05 LAB — ALPHA-GAL PANEL
ALPHA GAL IGE: 0.35 kU/L — AB (ref <0.10)
Beef (Bos spp) IgE: 0.14 kU/L (ref <0.35)
LAMB CLASS INTERPRETATION: 0
Lamb/Mutton (Ovis spp) IgE: <0.1 kU/L (ref <0.35)
PORK CLASS INTERPRETATION: 0

## 2018-02-08 ENCOUNTER — Other Ambulatory Visit: Payer: Self-pay | Admitting: Family

## 2018-02-08 DIAGNOSIS — L509 Urticaria, unspecified: Secondary | ICD-10-CM

## 2018-02-23 ENCOUNTER — Other Ambulatory Visit: Payer: Self-pay | Admitting: Family

## 2018-03-10 ENCOUNTER — Ambulatory Visit: Payer: 59 | Admitting: Allergy

## 2018-03-10 ENCOUNTER — Encounter: Payer: Self-pay | Admitting: Allergy

## 2018-03-10 VITALS — BP 128/80 | HR 78 | Temp 99.2°F | Resp 18 | Ht 67.5 in | Wt 275.4 lb

## 2018-03-10 DIAGNOSIS — T7840XD Allergy, unspecified, subsequent encounter: Secondary | ICD-10-CM

## 2018-03-10 DIAGNOSIS — T781XXD Other adverse food reactions, not elsewhere classified, subsequent encounter: Secondary | ICD-10-CM

## 2018-03-10 DIAGNOSIS — L509 Urticaria, unspecified: Secondary | ICD-10-CM | POA: Diagnosis not present

## 2018-03-10 DIAGNOSIS — T781XXA Other adverse food reactions, not elsewhere classified, initial encounter: Secondary | ICD-10-CM | POA: Insufficient documentation

## 2018-03-10 DIAGNOSIS — J3089 Other allergic rhinitis: Secondary | ICD-10-CM

## 2018-03-10 NOTE — Patient Instructions (Addendum)
   Today's skin testing showed:  Positive to dust mites, tree pollen and mildly to dog.   Get bloodwork and will make additional recommendations based on results.  Peanut and nut pane with reflex, CBC diff, CMP, ESR, CRP, Thyroid cascade, ANA w/reflex, C3, C4, tryptase.   Avoid peanuts, tree nuts.  For mild symptoms you can take over the counter antihistamines such as Benadryl and monitor symptoms closely. If symptoms worsen or if you have severe symptoms including breathing issues, throat closure, significant swelling, whole body hives, severe diarrhea and vomiting, lightheadedness then inject epinephrine and seek immediate medical care afterwards.  Food action plan given.  Hold off allegra for 2 weeks. If no issues not necessary to restart. If having issues then restart allegra '180mg'$  daily.  May try to reintroduce red meat back into her diet. If no issues then may consume without any limitations.   Follow up in 3 months.

## 2018-03-10 NOTE — Progress Notes (Signed)
New Patient Note  RE: Laura Hansen MRN: [id number] DOB: 04-Jan-1976 Date of Office Visit: 03/10/2018  Referring provider: Sharion Balloon, FNP Primary care provider: Sharion Balloon, FNP  Chief Complaint: Urticaria and Allergic Reaction  History of Present Illness: I had the pleasure of seeing Laura Hansen for initial evaluation at the [facility] [place] [facility] on 03/10/2018. She is a 43 y.o. female, who is referred here by Sharion Balloon, FNP for the evaluation of hives ad allergic reaction. Patient was seen in our office in 2016 for allergic reaction.   On 01/31/2018 patient had a steak for lunch which was leftover from the night before. She was working in [employer] and had a large cut on her hand. She was working with cardboard boxes which sometimes cause welts.   Patient noticed hive on her neck and had itchy eyes in the afternoon. She took benadryl liquid throughout the afternoon and night which did not help and had worsening symptoms with worsening hives. She went to see PCP the following day and was given a shot of steroid which did not help. The next day patient went back to PCP and had another steroid injection. She was placed on Pepcid, benadryl and oral prednisone taper which eventually helped. The pruritus resolved within 1 week but the rash/hives lasted for about 1 month.   Describes the rash/hives as raised, red and itchy. No ecchymosis upon resolution. Associated symptoms include: none. Suspected triggers are unknown. Denies any fevers, chills, changes in medications, foods, personal care products or recent infections. She has tried the following therapies: benadryl, Pepcid, prednisone with some benefit. Systemic steroids yes. Currently on allegra daily but stopped a few days ago.  Previous work up includes: alpha gal was slightly positive in December 2019. Previously ate red meat with no issues.  Previous history of rash/hives: cardboard box sometimes causes a  red line but no hives like this previously. Patient is up to date with the following cancer screening tests: mammogram, pap smears.  Assessment and Plan: Laura Hansen is a 43 y.o. female with: Allergic reaction Reaction with hives starting on 01/31/2018 which eventually resolved with steroid injection x 2, steroid taper, Pepcid and benadryl. Rash lasted for 1 month. Patient denies any changes in diet, medications, personal care products or infections during this time. She used to eat red meat quite frequently but recent bloodwork was borderline positive to alpha gal.   Today's skin testing showed: Positive to dust mites, tree pollen and mildly to dog.  Get bloodwork and will make additional recommendations based on results.  Discussed with patient that given clinical history and testing results today not sure what may have triggered this event.  Hold off allegra for 2 weeks. If no issues not necessary to restart. If having issues then restart allegra 193m daily. Zyrtec causes drowsiness.   May try to reintroduce red meat back into her diet. If no issues then may consume without any limitations.   For mild symptoms you can take over the counter antihistamines such as Benadryl and monitor symptoms closely. If symptoms worsen or if you have severe symptoms including breathing issues, throat closure, significant swelling, whole body hives, severe diarrhea and vomiting, lightheadedness then seek immediate medical care.  Urticaria See assessment and plan as above.  Adverse food reaction Reaction to peanut and tree nuts in the past. 2016 work up showed borderline positive to peanuts and almonds on skin testing and negative via immunocap.  Today's skin testing  showed: borderline positive to almonds.   Continue to avoid peanuts, tree nuts.  For mild symptoms you can take over the counter antihistamines such as Benadryl and monitor symptoms closely. If symptoms worsen or if you have severe symptoms  including breathing issues, throat closure, significant swelling, whole body hives, severe diarrhea and vomiting, lightheadedness then inject epinephrine and seek immediate medical care afterwards.  Food action plan given.  If immunocap negative then consider in office food challenge.   Other allergic rhinitis Rhinoconjunctivitis symptoms in the fall and spring for the past 35+ years. Usually takes allegra with some benefit. 2016 testing was positive to mold, tree, cat, dog, cockroach.  Today's skin testing showed:  Positive to dust mites, tree pollen and mildly to dog.  Discussed environmental control measures.  May use over the counter antihistamines such as Zyrtec (cetirizine), Claritin (loratadine), Allegra (fexofenadine), or Xyzal (levocetirizine) daily as needed.  Return in about 3 months (around 06/09/2018).  Lab Orders     IgE Nut Prof. w/Component Rflx     CBC With Differential     Comprehensive metabolic panel     Sed Rate (ESR)     C-reactive protein     Thyroid Cascade Profile     ANA w/Reflex     C3 and C4     Tryptase  Other allergy screening: Asthma: no Rhino conjunctivitis: yes  She reports symptoms of sneezing, rhinorrhea, itchy eyes. Symptoms have been going on for 35+ years. The symptoms are present during the spring and fall but worse in the fall.  She has used Human resources officer with some improvement in symptoms. Sinus infections: one. Previous work up includes: testing in 2016 was positive to mold, tree, cat, dog, cockroach.  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 The reaction occurred about 4 years ago, after she ate small amount of peanut butter and jelly uncrustable sandwich. Symptoms started within 15-20 minutes and was in the form of facial tightness, tongue getting large. The symptoms lasted for a few hours. She was not evaluated in ED. Since this episode, she does not report other accidental exposures to peanuts and tree nuts. She does   have access to epinephrine autoinjector and not needed to use it.   Past work up includes: spt in 2016 was borderline to peanut and almond, immunocap was negative.  Dietary History: patient has been eating other foods including milk, eggs, sesame, shellfish, seafood, soy, wheat,  white meats, fruits and vegetables.  She reports reading labels and avoiding peanuts, tree nuts, red meat in diet completely.   Medication allergy: yes  Amoxicillin - severe rash, swelling Codeine - hallucinations Hymenoptera allergy: no Urticaria: yes Eczema:no History of recurrent infections suggestive of immunodeficency: no  Diagnostics: Skin Testing: Environmental allergy panel and foods Positive test to: dust mite, tree pollen and borderline to dog and almonds. Results discussed with patient/family. Airborne Adult Perc - 03/10/18 0920    Time Antigen Placed  9147    Allergen Manufacturer  Lavella Hammock    Location  Back    Number of Test  59    Panel 1  Select    1. Control-Buffer 50% Glycerol  Negative    2. Control-Histamine 1 mg/ml  2+    3. Albumin saline  Negative    4. Sylva  Negative    5. [place]  2+    6. Johnson  Negative    7. Bucyrus Blue  Negative    8. Meadow Fescue  2+    9. Perennial Rye  Negative    10. Sweet Vernal  Negative    11. Timothy  Negative    12. Cocklebur  Negative    13. Burweed Marshelder  Negative    14. Ragweed, short  Negative    15. Ragweed, Giant  Negative    16. Plantain,  English  2+    17. Lamb's Quarters  Negative    18. Sheep Sorrell  Negative    19. Rough Pigweed  Negative    20. Marsh Elder, Rough  Negative    21. Mugwort, Common  Negative    22. Ash mix  Negative    23. Birch mix  Negative    24. Beech American  Negative    25. Box, Elder  Negative    26. Cedar, red  Negative    27. Cottonwood, [place]  Negative    28. Elm mix  Negative    29. Hickory mix  Negative    30. Maple mix  Negative    31. Oak, [place] mix  Negative    32. Pecan  Pollen  Negative    33. Pine mix  Negative    34. Sycamore Eastern  Negative    35. [place], Black Pollen  2+    36. Alternaria alternata  Negative    37. Cladosporium Herbarum  Negative    38. Aspergillus mix  Negative    39. Penicillium mix  Negative    40. Bipolaris sorokiniana (Helminthosporium)  Negative    41. Drechslera spicifera (Curvularia)  Negative    42. Mucor plumbeus  Negative    43. Fusarium moniliforme  Negative    44. Aureobasidium pullulans (pullulara)  Negative    45. Rhizopus oryzae  Negative    46. Botrytis cinera  Negative    47. Epicoccum nigrum  Negative    48. Phoma betae  Negative    49. Candida Albicans  Negative    50. Trichophyton mentagrophytes  Negative    51. Mite, D Farinae  5,000 AU/ml  3+    52. Mite, D Pteronyssinus  5,000 AU/ml  3+    53. Cat Hair 10,000 BAU/ml  Negative    54.  Dog Epithelia  Negative    55. Mixed Feathers  Negative    56. Horse Epithelia  Negative    57. Cockroach, German  Negative    58. Mouse  Negative    59. Tobacco Leaf  Negative     Intradermal - 03/10/18 1003    Time Antigen Placed  1010    Allergen Manufacturer  Greer    Location  Arm    Number of Test  12    Intradermal  Select    Control  Negative    7 Grass  Negative    Ragweed mix  Negative    Weed mix  Negative    Tree mix  Negative    Mold 1  Negative    Mold 2  Negative    Mold 3  Negative    Mold 4  Negative    Cat  Negative    Dog  --   +/-   Cockroach  Negative     Food Adult Perc - 03/10/18 0921    Time Antigen Placed  2694    Allergen Manufacturer  Lavella Hammock    Location  Back    Number of allergen test  20     Control-buffer 50% Glycerol  Negative  Control-Histamine 1 mg/ml  2+    1. Peanut  Negative    2. Soybean  Negative    3. Wheat  Negative    4. Sesame  Negative    5. Milk, cow  Negative    6. Egg White, Chicken  Negative    7. Casein  Negative    8. Shellfish Mix  Negative    9. Fish Mix  Negative    10. Cashew  Negative     11. Pecan Food  Negative    12. Wood[place]  Negative    13. Almond  --   +/-   14. Hazelnut  Negative    15. [place] nut  Negative    16. Coconut  Negative    17. Pistachio  Negative    37. Pork  Negative    39. Chicken Meat  Negative    40. Beef  Negative       Past Medical History: Patient Active Problem List   Diagnosis Date Noted  . Urticaria 03/10/2018  . Adverse food reaction 03/10/2018  . Other allergic rhinitis 03/10/2018  . RUQ abdominal pain 07/25/2015  . Obesity (BMI 30-39.9) 07/12/2015  . GAD (generalized anxiety disorder) 07/12/2015  . Allergic reaction 12/18/2014  . GERD 10/10/2009   Past Medical History:  Diagnosis Date  . Angio-edema   . Anxiety   . Depression   . Eczema    as a child  . GERD (gastroesophageal reflux disease)   . H. pylori infection   . Hematuria   . Hepatic steatosis   . Urticaria    Past Surgical History: Past Surgical History:  Procedure Laterality Date  . BREAST BIOPSY Right 06/12/2015   benign   . CHOLECYSTECTOMY    . colonscopy    . UPPER GASTROINTESTINAL ENDOSCOPY     Medication List:  Current Outpatient Medications  Medication Sig Dispense Refill  . EPINEPHrine 0.3 mg/0.3 mL IJ SOAJ injection Inject 0.3 mg into the muscle as needed for anaphylaxis.    . fexofenadine (ALLEGRA) 180 MG tablet Take 180 mg by mouth daily.    Marland Kitchen triamcinolone ointment (KENALOG) 0.5 % Apply 1 application topically 2 (two) times daily. 60 g 0   No current facility-administered medications for this visit.    Allergies: Allergies  Allergen Reactions  . Peanut-Containing Drug Products Anaphylaxis    All nuts  . Amoxicillin   . Codeine    Social History: Social History   Socioeconomic History  . Marital status: Married    Spouse name: Not on file  . Number of children: Not on file  . Years of education: Not on file  . Highest education level: Not on file  Occupational History  . Not on file  Social Needs  . Financial resource  strain: Not on file  . Food insecurity:    Worry: Not on file    Inability: Not on file  . Transportation needs:    Medical: Not on file    Non-medical: Not on file  Tobacco Use  . Smoking status: Never Smoker  . Smokeless tobacco: Never Used  Substance and Sexual Activity  . Alcohol use: Yes    Comment: SOCIAL   . Drug use: No  . Sexual activity: Not on file  Lifestyle  . Physical activity:    Days per week: Not on file    Minutes per session: Not on file  . Stress: Not on file  Relationships  . Social connections:  Talks on phone: Not on file    Gets together: Not on file    Attends religious service: Not on file    Active member of club or organization: Not on file    Attends meetings of clubs or organizations: Not on file    Relationship status: Not on file  Other Topics Concern  . Not on file  Social History Narrative  . Not on file   Lives in a home built in the [place]. Smoking: denies Occupation: supply Government social research officer History: Water Damage/mildew in the house: no Carpet in the family room: no Carpet in the bedroom: yes Heating: electric and wood Cooling: central Pet: yes cats and dogs  Family History: Family History  Problem Relation Age of Onset  . Breast cancer Mother   . Colon cancer Neg Hx   . Allergic rhinitis Neg Hx   . Asthma Neg Hx   . Eczema Neg Hx   . Urticaria Neg Hx     Review of Systems  Constitutional: Negative for appetite change, chills, fever and unexpected weight change.  HENT: Negative for congestion and rhinorrhea.   Eyes: Negative for itching.  Respiratory: Negative for cough, chest tightness, shortness of breath and wheezing.   Cardiovascular: Negative for chest pain.  Gastrointestinal: Negative for abdominal pain.  Genitourinary: Negative for difficulty urinating.  Skin: Positive for rash.  Allergic/Immunologic: Positive for environmental allergies and food allergies.  Neurological: Negative for headaches.     Objective: BP 128/80 (BP Location: Left Arm, Patient Position: Sitting, Cuff Size: Normal)   Pulse 78   Temp 99.2 F (37.3 C) (Oral)   Resp 18   Ht 5' 7.5" (1.715 m)   Wt 275 lb 6.4 oz (124.9 kg)   SpO2 96%   BMI 42.50 kg/m  Body mass index is 42.5 kg/m. Physical Exam  Constitutional: She is oriented to person, place, and time. She appears well-developed and well-nourished.  HENT:  Head: Normocephalic and atraumatic.  Right Ear: External ear normal.  Left Ear: External ear normal.  Nose: Nose normal.  Mouth/Throat: Oropharynx is clear and moist.  Eyes: Conjunctivae and EOM are normal.  Neck: Neck supple.  Cardiovascular: Normal rate, regular rhythm and normal heart sounds. Exam reveals no gallop and no friction rub.  No murmur heard. Pulmonary/Chest: Effort normal and breath sounds normal. She has no wheezes. She has no rales.  Abdominal: Soft.  Lymphadenopathy:    She has no cervical adenopathy.  Neurological: She is alert and oriented to person, place, and time.  Skin: Skin is warm. No rash noted.  No dermatographism on exam.  Psychiatric: She has a normal mood and affect. Her behavior is normal.  Nursing note and vitals reviewed.  The plan was reviewed with the patient/family, and all questions/concerned were addressed.  It was my pleasure to see Laura Hansen today and participate in her care. Please feel free to contact me with any questions or concerns.  Sincerely,  Rexene Alberts, DO Allergy & Immunology  [facility] office: [phone] [facility] office: [phone]

## 2018-03-10 NOTE — Assessment & Plan Note (Signed)
Reaction with hives starting on 01/31/2018 which eventually resolved with steroid injection x 2, steroid taper, Pepcid and benadryl. Rash lasted for 1 month. Patient denies any changes in diet, medications, personal care products or infections during this time. She used to eat red meat quite frequently but recent bloodwork was borderline positive to alpha gal.   Today's skin testing showed: Positive to dust mites, tree pollen and mildly to dog.  Get bloodwork and will make additional recommendations based on results.  Discussed with patient that given clinical history and testing results today not sure what may have triggered this event.  Hold off allegra for 2 weeks. If no issues not necessary to restart. If having issues then restart allegra 180mg  daily. Zyrtec causes drowsiness.   May try to reintroduce red meat back into her diet. If no issues then may consume without any limitations.   For mild symptoms you can take over the counter antihistamines such as Benadryl and monitor symptoms closely. If symptoms worsen or if you have severe symptoms including breathing issues, throat closure, significant swelling, whole body hives, severe diarrhea and vomiting, lightheadedness then seek immediate medical care.

## 2018-03-10 NOTE — Assessment & Plan Note (Signed)
.   See assessment and plan as above. 

## 2018-03-10 NOTE — Assessment & Plan Note (Addendum)
Rhinoconjunctivitis symptoms in the fall and spring for the past 35+ years. Usually takes allegra with some benefit. 2016 testing was positive to mold, tree, cat, dog, cockroach.  Today's skin testing showed:  Positive to dust mites, tree pollen and mildly to dog.  Discussed environmental control measures.  May use over the counter antihistamines such as Zyrtec (cetirizine), Claritin (loratadine), Allegra (fexofenadine), or Xyzal (levocetirizine) daily as needed.

## 2018-03-10 NOTE — Assessment & Plan Note (Signed)
Reaction to peanut and tree nuts in the past. 2016 work up showed borderline positive to peanuts and almonds on skin testing and negative via immunocap.  Today's skin testing showed: borderline positive to almonds.   Continue to avoid peanuts, tree nuts.  For mild symptoms you can take over the counter antihistamines such as Benadryl and monitor symptoms closely. If symptoms worsen or if you have severe symptoms including breathing issues, throat closure, significant swelling, whole body hives, severe diarrhea and vomiting, lightheadedness then inject epinephrine and seek immediate medical care afterwards.  Food action plan given.  If immunocap negative then consider in office food challenge.

## 2018-03-13 LAB — COMPREHENSIVE METABOLIC PANEL
A/G RATIO: 1.8 (ref 1.2–2.2)
ALK PHOS: 79 IU/L (ref 39–117)
ALT: 13 IU/L (ref 0–32)
AST: 14 IU/L (ref 0–40)
Albumin: 4.4 g/dL (ref 3.8–4.8)
BUN/Creatinine Ratio: 20 (ref 9–23)
BUN: 11 mg/dL (ref 6–24)
Bilirubin Total: 0.6 mg/dL (ref 0.0–1.2)
CALCIUM: 9.3 mg/dL (ref 8.7–10.2)
CO2: 23 mmol/L (ref 20–29)
Chloride: 101 mmol/L (ref 96–106)
Creatinine, Ser: 0.55 mg/dL — ABNORMAL LOW (ref 0.57–1.00)
GFR calc Af Amer: 134 mL/min/{1.73_m2} (ref >59)
GFR, EST NON AFRICAN AMERICAN: 116 mL/min/{1.73_m2} (ref >59)
GLOBULIN, TOTAL: 2.5 g/dL (ref 1.5–4.5)
Glucose: 71 mg/dL (ref 65–99)
POTASSIUM: 4.3 mmol/L (ref 3.5–5.2)
SODIUM: 140 mmol/L (ref 134–144)
Total Protein: 6.9 g/dL (ref 6.0–8.5)

## 2018-03-13 LAB — CBC WITH DIFFERENTIAL
Basophils Absolute: 0.1 10*3/uL (ref 0.0–0.2)
Basos: 0 %
EOS (ABSOLUTE): 0.2 10*3/uL (ref 0.0–0.4)
Eos: 2 %
Hematocrit: 38.9 % (ref 34.0–46.6)
Hemoglobin: 13.4 g/dL (ref 11.1–15.9)
Immature Grans (Abs): 0 10*3/uL (ref 0.0–0.1)
Immature Granulocytes: 0 %
Lymphocytes Absolute: 2.2 10*3/uL (ref 0.7–3.1)
Lymphs: 16 %
MCH: 30.2 pg (ref 26.6–33.0)
MCHC: 34.4 g/dL (ref 31.5–35.7)
MCV: 88 fL (ref 79–97)
Monocytes Absolute: 0.9 10*3/uL (ref 0.1–0.9)
Monocytes: 6 %
Neutrophils Absolute: 10.1 10*3/uL — ABNORMAL HIGH (ref 1.4–7.0)
Neutrophils: 76 %
RBC: 4.44 x10E6/uL (ref 3.77–5.28)
RDW: 12.8 % (ref 11.7–15.4)
WBC: 13.5 10*3/uL — ABNORMAL HIGH (ref 3.4–10.8)

## 2018-03-13 LAB — IGE NUT PROF. W/COMPONENT RFLX
Brazil Nut IgE: <0.1 kU/L
F020-IgE Almond: <0.1 kU/L
F202-IgE Cashew Nut: <0.1 kU/L
F203-IgE Pistachio Nut: <0.1 kU/L
Hazelnut (Filbert) IgE: <0.1 kU/L
Macadamia Nut, IgE: <0.1 kU/L
Peanut, IgE: <0.1 kU/L
Pecan Nut IgE: <0.1 kU/L
Walnut IgE: <0.1 kU/L

## 2018-03-13 LAB — TRYPTASE: Tryptase: 2.6 ug/L (ref 2.2–13.2)

## 2018-03-13 LAB — C3 AND C4
Complement C3, Serum: 166 mg/dL (ref 82–167)
Complement C4, Serum: 33 mg/dL (ref 14–44)

## 2018-03-13 LAB — ANA W/REFLEX: Anti Nuclear Antibody(ANA): NEGATIVE

## 2018-03-13 LAB — C-REACTIVE PROTEIN: CRP: 5 mg/L (ref 0–10)

## 2018-03-13 LAB — SEDIMENTATION RATE: Sed Rate: 32 mm/hr (ref 0–32)

## 2018-03-13 LAB — THYROID CASCADE PROFILE: TSH: 2.11 u[IU]/mL (ref 0.450–4.500)

## 2018-03-15 ENCOUNTER — Encounter: Payer: Self-pay | Admitting: Allergy

## 2018-06-09 ENCOUNTER — Ambulatory Visit: Payer: 59 | Admitting: Allergy

## 2019-01-10 ENCOUNTER — Other Ambulatory Visit: Payer: Self-pay

## 2019-01-10 DIAGNOSIS — Z20822 Contact with and (suspected) exposure to covid-19: Secondary | ICD-10-CM

## 2019-01-11 LAB — NOVEL CORONAVIRUS, NAA: SARS-CoV-2, NAA: NOT DETECTED

## 2019-09-19 DIAGNOSIS — R229 Localized swelling, mass and lump, unspecified: Secondary | ICD-10-CM | POA: Diagnosis not present

## 2019-09-19 DIAGNOSIS — W57XXXA Bitten or stung by nonvenomous insect and other nonvenomous arthropods, initial encounter: Secondary | ICD-10-CM | POA: Diagnosis not present

## 2019-12-21 ENCOUNTER — Other Ambulatory Visit: Payer: Self-pay

## 2019-12-21 ENCOUNTER — Ambulatory Visit
Admission: RE | Admit: 2019-12-21 | Discharge: 2019-12-21 | Disposition: A | Discharge status: Home / Self Care | Payer: 59 | Source: Ambulatory Visit | Attending: Emergency Medicine | Admitting: Emergency Medicine

## 2019-12-21 VITALS — BP 153/83 | HR 83 | Temp 98.6°F | Resp 19 | Ht 70.0 in | Wt 280.0 lb

## 2019-12-21 DIAGNOSIS — J019 Acute sinusitis, unspecified: Secondary | ICD-10-CM

## 2019-12-21 DIAGNOSIS — R059 Cough, unspecified: Secondary | ICD-10-CM

## 2019-12-21 MED ORDER — DEXAMETHASONE SODIUM PHOSPHATE 10 MG/ML IJ SOLN
10.0000 mg | Freq: Once | INTRAMUSCULAR | Status: AC
  Administered 2019-12-21: 10 mg via INTRAMUSCULAR

## 2019-12-21 MED ORDER — DOXYCYCLINE HYCLATE 100 MG PO CAPS: 100.0000 mg | ORAL_CAPSULE | Freq: Two times a day (BID) | ORAL | 0 refills | Status: DC

## 2019-12-21 MED ORDER — BENZONATATE 100 MG PO CAPS: 100.0000 mg | ORAL_CAPSULE | Freq: Three times a day (TID) | ORAL | 0 refills | Status: DC

## 2019-12-21 MED ORDER — PREDNISONE 10 MG (21) PO TBPK: ORAL_TABLET | Freq: Every day | ORAL | 0 refills | Status: DC

## 2019-12-21 NOTE — ED Provider Notes (Addendum)
[place]   [id number] 12/21/19 Arrival Time: 1550   CC: COVID symptoms  SUBJECTIVE: History from: patient.  Laura Hansen is a 44 y.o. female who presents with cough, sinus and chest congestion x 6 days.  Denies sick exposure to COVID, flu or strep.   Has tried OTC decongestants without relief.  Symptoms are made worse with deep breath.  Reports previous symptoms in the past with sinus infection and bronchitis.  Denies previous covid infection.   Denies fever, chills, SOB, chest pain, nausea, changes in bowel or bladder habits.    ROS: As per HPI.  All other pertinent ROS negative.     Past Medical History:  Diagnosis Date  . Angio-edema   . Anxiety   . Eczema    as a child  . GERD (gastroesophageal reflux disease)   . H. pylori infection   . Hematuria   . Hepatic steatosis   . Urticaria    Past Surgical History:  Procedure Laterality Date  . BREAST BIOPSY Right 06/12/2015   benign   . CHOLECYSTECTOMY    . colonscopy    . UPPER GASTROINTESTINAL ENDOSCOPY     Allergies  Allergen Reactions  . Peanut-Containing Drug Products Anaphylaxis    All nuts  . Amoxicillin   . Codeine    No current facility-administered medications on file prior to encounter.   Current Outpatient Medications on File Prior to Encounter  Medication Sig Dispense Refill  . EPINEPHrine 0.3 mg/0.3 mL IJ SOAJ injection Inject 0.3 mg into the muscle as needed for anaphylaxis.    . fexofenadine (ALLEGRA) 180 MG tablet Take 180 mg by mouth daily.    Marland Kitchen triamcinolone ointment (KENALOG) 0.5 % Apply 1 application topically 2 (two) times daily. 60 g 0   Social History   Socioeconomic History  . Marital status: Married    Spouse name: Not on file  . Number of children: Not on file  . Years of education: Not on file  . Highest education level: Not on file  Occupational History  . Not on file  Tobacco Use  . Smoking status: Never Smoker  . Smokeless tobacco: Never Used  Vaping Use    . Vaping Use: Never used  Substance and Sexual Activity  . Alcohol use: Yes    Comment: SOCIAL   . Drug use: No  . Sexual activity: Not on file  Other Topics Concern  . Not on file  Social History Narrative  . Not on file   Social Determinants of Health   Financial Resource Strain:   . Difficulty of Paying Living Expenses: Not on file  Food Insecurity:   . Worried About Charity fundraiser in the Last Year: Not on file  . Ran Out of Food in the Last Year: Not on file  Transportation Needs:   . Lack of Transportation (Medical): Not on file  . Lack of Transportation (Non-Medical): Not on file  Physical Activity:   . Days of Exercise per Week: Not on file  . Minutes of Exercise per Session: Not on file  Stress:   . Feeling of Stress : Not on file  Social Connections:   . Frequency of Communication with Friends and Family: Not on file  . Frequency of Social Gatherings with Friends and Family: Not on file  . Attends Religious Services: Not on file  . Active Member of Clubs or Organizations: Not on file  . Attends Archivist Meetings: Not on file  .  Marital Status: Not on file  Intimate Partner Violence:   . Fear of Current or Ex-Partner: Not on file  . Emotionally Abused: Not on file  . Physically Abused: Not on file  . Sexually Abused: Not on file   Family History  Problem Relation Age of Onset  . Breast cancer Mother   . Colon cancer Neg Hx   . Allergic rhinitis Neg Hx   . Asthma Neg Hx   . Eczema Neg Hx   . Urticaria Neg Hx     OBJECTIVE:  Vitals:   12/21/19 1619 12/21/19 1620  BP: (!) 153/83   Pulse: 83   Resp: 19   Temp: 98.6 F (37 C)   TempSrc: Oral   SpO2: 97%   Weight:  280 lb (127 kg)  Height:  5\' 10"  (1.778 m)    General appearance: alert; appears fatigued, but nontoxic; speaking in full sentences and tolerating own secretions HEENT: NCAT; Ears: EACs clear, TMs pearly gray; Eyes: PERRL.  EOM grossly intact. Sinuses: TTP; Nose: nares  patent without rhinorrhea, Throat: oropharynx clear, tonsils non erythematous or enlarged, uvula midline  Neck: supple without LAD Lungs: unlabored respirations, symmetrical air entry; cough: moderate; no respiratory distress; CTAB Heart: regular rate and rhythm.  Skin: warm and dry Psychological: alert and cooperative; normal mood and affect  ASSESSMENT & PLAN:  1. Cough   2. Acute non-recurrent sinusitis, unspecified location     Meds ordered this encounter  Medications  . dexamethasone (DECADRON) injection 10 mg  . doxycycline (VIBRAMYCIN) 100 MG capsule    Sig: Take 1 capsule (100 mg total) by mouth 2 (two) times daily.    Dispense:  20 capsule    Refill:  0    Order Specific Question:   Supervising Provider    Answer:   Raylene Everts [[id number]]  . benzonatate (TESSALON) 100 MG capsule    Sig: Take 1 capsule (100 mg total) by mouth every 8 (eight) hours.    Dispense:  21 capsule    Refill:  0    Order Specific Question:   Supervising Provider    Answer:   Raylene Everts [[id number]]  . predniSONE (STERAPRED UNI-PAK 21 TAB) 10 MG (21) TBPK tablet    Sig: Take by mouth daily. Take 6 tabs by mouth daily  for 2 days, then 5 tabs for 2 days, then 4 tabs for 2 days, then 3 tabs for 2 days, 2 tabs for 2 days, then 1 tab by mouth daily for 2 days    Dispense:  42 tablet    Refill:  0    Order Specific Question:   Supervising Provider    Answer:   Raylene Everts [[id number]]    COVID testing ordered.  It will take between 5-7 days for test results.  Someone will contact you regarding abnormal results.    In the meantime: You should remain isolated in your home for 10 days from symptom onset AND greater than 72 hours after symptoms resolution (absence of fever without the use of fever-reducing medication and improvement in respiratory symptoms), whichever is longer Get plenty of rest and push fluids Tessalon Perles prescribed for cough Use OTC zyrtec for nasal congestion,  runny nose, and/or sore throat Use OTC flonase for nasal congestion and runny nose Use medications daily for symptom relief Use OTC medications like ibuprofen or tylenol as needed fever or pain Call or go to the ED if you have any new or worsening symptoms  such as fever, worsening cough, shortness of breath, chest tightness, chest pain, turning blue, changes in mental status, etc...   Steroid shot given in office as well Doxycycline for sinus infection Prednisone for congestion  Reviewed expectations re: course of current medical issues. Questions answered. Outlined signs and symptoms indicating need for more acute intervention. Patient verbalized understanding. After Visit Summary given.         Lestine Box, PA-C 12/21/19 [place], [place], PA-C 12/21/19 1638

## 2019-12-21 NOTE — ED Triage Notes (Signed)
Cough, sinus and chest congestion since Friday.  Has tried otc decongestants with no relief.

## 2019-12-21 NOTE — Discharge Instructions (Signed)

## 2019-12-22 LAB — NOVEL CORONAVIRUS, NAA: SARS-CoV-2, NAA: NOT DETECTED

## 2019-12-22 LAB — SARS-COV-2, NAA 2 DAY TAT

## 2019-12-27 DIAGNOSIS — Z01419 Encounter for gynecological examination (general) (routine) without abnormal findings: Secondary | ICD-10-CM | POA: Diagnosis not present

## 2019-12-27 DIAGNOSIS — Z6841 Body Mass Index (BMI) 40.0 and over, adult: Secondary | ICD-10-CM | POA: Diagnosis not present

## 2019-12-27 DIAGNOSIS — Z1231 Encounter for screening mammogram for malignant neoplasm of breast: Secondary | ICD-10-CM | POA: Diagnosis not present

## 2020-01-05 ENCOUNTER — Other Ambulatory Visit: Payer: Self-pay

## 2020-01-05 ENCOUNTER — Ambulatory Visit
Admission: RE | Admit: 2020-01-05 | Discharge: 2020-01-05 | Disposition: A | Discharge status: Home / Self Care | Payer: 59 | Source: Ambulatory Visit | Attending: Emergency Medicine | Admitting: Emergency Medicine

## 2020-01-05 ENCOUNTER — Ambulatory Visit (INDEPENDENT_AMBULATORY_CARE_PROVIDER_SITE_OTHER): Payer: 59

## 2020-01-05 VITALS — BP 147/93 | HR 81 | Temp 98.7°F | Resp 18

## 2020-01-05 DIAGNOSIS — R059 Cough, unspecified: Secondary | ICD-10-CM

## 2020-01-05 DIAGNOSIS — R0789 Other chest pain: Secondary | ICD-10-CM

## 2020-01-05 DIAGNOSIS — J22 Unspecified acute lower respiratory infection: Secondary | ICD-10-CM

## 2020-01-05 MED ORDER — ALBUTEROL SULFATE HFA 108 (90 BASE) MCG/ACT IN AERS
2.0000 | INHALATION_SPRAY | Freq: Once | RESPIRATORY_TRACT | Status: AC
  Administered 2020-01-05: 2 via RESPIRATORY_TRACT

## 2020-01-05 MED ORDER — LEVOFLOXACIN 500 MG PO TABS: 500.0000 mg | ORAL_TABLET | Freq: Every day | ORAL | 0 refills | Status: AC

## 2020-01-05 MED ORDER — METHYLPREDNISOLONE SODIUM SUCC 125 MG IJ SOLR
125.0000 mg | Freq: Once | INTRAMUSCULAR | Status: AC
  Administered 2020-01-05: 125 mg via INTRAMUSCULAR

## 2020-01-05 NOTE — ED Triage Notes (Signed)
Pt presents with continued cough after doxy and prednisone

## 2020-01-05 NOTE — Discharge Instructions (Signed)
Unable to rule out blood clot in urgent care setting.  Offered patient further evaluation and management in the ED.  Patient declines at this time and would like to try outpatient therapy first.  Aware of the risk associated with this decision including missed diagnosis, organ damage, organ failure, and/or death.  Patient aware and in agreement.     Chest x-ray negative for cardiopulmonary disease, however, based on symptoms will cover for lower respiratory infection Levaquin prescribed.  Take as directed and to completion Continue with prednisone as prescribed Albuterol inhaler given in office Solumedrol shot given in office Follow up with PCP next week for recheck and/or if symptoms persists Return or go to ER if you have any new or worsening symptoms such as fever, chills, fatigue, shortness of breath, wheezing, chest pain, nausea, changes in bowel or bladder habits, etc..Marland Kitchen

## 2020-01-05 NOTE — ED Provider Notes (Signed)
[place]   [id number] 01/05/20 Arrival Time: 1884  Cc: COUGH  SUBJECTIVE:  Laura Hansen is a 44 y.o. female who presents with dry cough, fatigue, chest tightness/ pressure x 3 weeks, fever today of 100.1.  Denies positive sick exposure or precipitating event.   Was seen on 12/21/19 and treated with steroid shot, prednisone (started prednisone 3 days ago) and antibiotic without relief.  Symptoms are made worse with heat and coughing.  Reports improvement with cold weather.  Reports previous symptoms in the past with bronchitis.   Reports associated chest and back soreness secondary to cough.  Denies chills, sinus pain, rhinorrhea, sore throat, SOB, wheezing, chest pain, nausea, changes in bowel or bladder habits.    Denies SOB, calf pain or swelling, recent long travel, recent surgery, hormone use, tobacco use, malignancy, pregnancy, hx of blood clot.     ROS: As per HPI.  All other pertinent ROS negative.     Past Medical History:  Diagnosis Date  . Angio-edema   . Anxiety   . Eczema    as a child  . GERD (gastroesophageal reflux disease)   . H. pylori infection   . Hematuria   . Hepatic steatosis   . Urticaria    Past Surgical History:  Procedure Laterality Date  . BREAST BIOPSY Right 06/12/2015   benign   . CHOLECYSTECTOMY    . colonscopy    . UPPER GASTROINTESTINAL ENDOSCOPY     Allergies  Allergen Reactions  . Peanut-Containing Drug Products Anaphylaxis    All nuts  . Amoxicillin   . Codeine    No current facility-administered medications on file prior to encounter.   Current Outpatient Medications on File Prior to Encounter  Medication Sig Dispense Refill  . EPINEPHrine 0.3 mg/0.3 mL IJ SOAJ injection Inject 0.3 mg into the muscle as needed for anaphylaxis.    . fexofenadine (ALLEGRA) 180 MG tablet Take 180 mg by mouth daily.    . predniSONE (STERAPRED UNI-PAK 21 TAB) 10 MG (21) TBPK tablet Take by mouth daily. Take 6 tabs by mouth daily  for 2  days, then 5 tabs for 2 days, then 4 tabs for 2 days, then 3 tabs for 2 days, 2 tabs for 2 days, then 1 tab by mouth daily for 2 days 42 tablet 0  . triamcinolone ointment (KENALOG) 0.5 % Apply 1 application topically 2 (two) times daily. 60 g 0    Social History   Socioeconomic History  . Marital status: Married    Spouse name: Not on file  . Number of children: Not on file  . Years of education: Not on file  . Highest education level: Not on file  Occupational History  . Not on file  Tobacco Use  . Smoking status: Never Smoker  . Smokeless tobacco: Never Used  Vaping Use  . Vaping Use: Never used  Substance and Sexual Activity  . Alcohol use: Yes    Comment: SOCIAL   . Drug use: No  . Sexual activity: Not on file  Other Topics Concern  . Not on file  Social History Narrative  . Not on file   Social Determinants of Health   Financial Resource Strain:   . Difficulty of Paying Living Expenses: Not on file  Food Insecurity:   . Worried About Charity fundraiser in the Last Year: Not on file  . Ran Out of Food in the Last Year: Not on file  Transportation Needs:   .  Lack of Transportation (Medical): Not on file  . Lack of Transportation (Non-Medical): Not on file  Physical Activity:   . Days of Exercise per Week: Not on file  . Minutes of Exercise per Session: Not on file  Stress:   . Feeling of Stress : Not on file  Social Connections:   . Frequency of Communication with Friends and Family: Not on file  . Frequency of Social Gatherings with Friends and Family: Not on file  . Attends Religious Services: Not on file  . Active Member of Clubs or Organizations: Not on file  . Attends Archivist Meetings: Not on file  . Marital Status: Not on file  Intimate Partner Violence:   . Fear of Current or Ex-Partner: Not on file  . Emotionally Abused: Not on file  . Physically Abused: Not on file  . Sexually Abused: Not on file   Family History  Problem Relation  Age of Onset  . Breast cancer Mother   . Colon cancer Neg Hx   . Allergic rhinitis Neg Hx   . Asthma Neg Hx   . Eczema Neg Hx   . Urticaria Neg Hx      OBJECTIVE:  Vitals:   01/05/20 1658  BP: (!) 147/93  Pulse: 81  Resp: 18  Temp: 98.7 F (37.1 C)  SpO2: 97%     General appearance: Alert, appears fatigued, but nontoxic; speaking in full sentences without difficulty HEENT:NCAT; Ears: EACs clear, TMs pearly gray; Eyes: PERRL.  EOM grossly intact. Nose: nares patent without rhinorrhea; Throat: tonsils nonerythematous or enlarged, uvula midline  Neck: supple without LAD Lungs: clear to auscultation bilaterally without adventitious breath sounds; normal respiratory effort; mild cough present Heart: regular rate and rhythm.   Skin: warm and dry Psychological: alert and cooperative; normal mood and affect  DIAGNOSTIC STUDIES:  DG Chest 2 View  Result Date: 01/05/2020 CLINICAL DATA:  Cough EXAM: CHEST - 2 VIEW COMPARISON:  April 27, 2017 FINDINGS: Lungs are clear. Heart size and pulmonary vascularity are normal. No adenopathy. No bone lesions. IMPRESSION: Lungs clear.  Heart size normal. Electronically Signed   By: Lowella Grip III M.D.   On: 01/05/2020 17:28    X-rays negative for cardiopulmonary disease  I have reviewed the x-rays myself and the radiologist interpretation. I am in agreement with the radiologist interpretation.     ASSESSMENT & PLAN:  1. Cough   2. Lower respiratory tract infection   3. Feeling of chest tightness     Meds ordered this encounter  Medications  . levofloxacin (LEVAQUIN) 500 MG tablet    Sig: Take 1 tablet (500 mg total) by mouth daily.    Dispense:  5 tablet    Refill:  0    Order Specific Question:   Supervising Provider    Answer:   Raylene Everts [[id number]]  . methylPREDNISolone sodium succinate (SOLU-MEDROL) 125 mg/2 mL injection 125 mg  . albuterol (VENTOLIN HFA) 108 (90 Base) MCG/ACT inhaler 2 puff    Orders Placed  This Encounter  Procedures  . DG Chest 2 View    Standing Status:   Standing    Number of Occurrences:   1    Order Specific Question:   Reason for Exam (SYMPTOM  OR DIAGNOSIS REQUIRED)    Answer:   persistant cough    Unable to rule out blood clot in urgent care setting.  Offered patient further evaluation and management in the ED.  Patient declines at this  time and would like to try outpatient therapy first.  Aware of the risk associated with this decision including missed diagnosis, organ damage, organ failure, and/or death.  Patient aware and in agreement.     Chest x-ray negative for cardiopulmonary disease, however, based on symptoms will cover for lower respiratory infection Levaquin prescribed.  Take as directed and to completion Continue with prednisone as prescribed Albuterol inhaler given in office Solumedrol shot given in office Follow up with PCP next week for recheck and/or if symptoms persists Return or go to ER if you have any new or worsening symptoms such as fever, chills, fatigue, shortness of breath, wheezing, chest pain, nausea, changes in bowel or bladder habits, etc...  Reviewed expectations re: course of current medical issues. Questions answered. Outlined signs and symptoms indicating need for more acute intervention. Patient verbalized understanding. After Visit Summary given.          Lestine Box, PA-C 01/05/20 1736

## 2021-04-01 ENCOUNTER — Ambulatory Visit (INDEPENDENT_AMBULATORY_CARE_PROVIDER_SITE_OTHER): Payer: 59

## 2021-04-01 ENCOUNTER — Other Ambulatory Visit: Payer: Self-pay

## 2021-04-01 ENCOUNTER — Ambulatory Visit
Admission: RE | Admit: 2021-04-01 | Discharge: 2021-04-01 | Disposition: A | Discharge status: Home / Self Care | Payer: 59 | Source: Ambulatory Visit | Attending: Urgent Care | Admitting: Urgent Care

## 2021-04-01 VITALS — BP 144/88 | HR 77 | Temp 98.1°F | Resp 20

## 2021-04-01 DIAGNOSIS — M79672 Pain in left foot: Secondary | ICD-10-CM | POA: Diagnosis not present

## 2021-04-01 MED ORDER — NAPROXEN 500 MG PO TABS: 500.0000 mg | ORAL_TABLET | Freq: Two times a day (BID) | ORAL | 0 refills | Status: DC

## 2021-04-01 NOTE — ED Provider Notes (Signed)
[facility]   MRN: [id number] DOB: [birth year]  Subjective:   Laura Hansen is a 46 y.o. female presenting for 1 week history of persistent left foot pain from the pinky finger laterally all the way across to her foot.  No fall, trauma.  Patient has been very active, walking about 5 miles a day.  Walks on hard surfaces.  No bruising.  She has had some swelling.  Has been alternating Aleve and ibuprofen.  No current facility-administered medications for this encounter.  Current Outpatient Medications:    EPINEPHrine 0.3 mg/0.3 mL IJ SOAJ injection, Inject 0.3 mg into the muscle as needed for anaphylaxis., Disp: , Rfl:    fexofenadine (ALLEGRA) 180 MG tablet, Take 180 mg by mouth daily., Disp: , Rfl:    levofloxacin (LEVAQUIN) 500 MG tablet, Take 1 tablet (500 mg total) by mouth daily., Disp: 5 tablet, Rfl: 0   predniSONE (STERAPRED UNI-PAK 21 TAB) 10 MG (21) TBPK tablet, Take by mouth daily. Take 6 tabs by mouth daily  for 2 days, then 5 tabs for 2 days, then 4 tabs for 2 days, then 3 tabs for 2 days, 2 tabs for 2 days, then 1 tab by mouth daily for 2 days, Disp: 42 tablet, Rfl: 0   triamcinolone ointment (KENALOG) 0.5 %, Apply 1 application topically 2 (two) times daily., Disp: 60 g, Rfl: 0   Allergies  Allergen Reactions   Peanut-Containing Drug Products Anaphylaxis    All nuts   Amoxicillin    Codeine     Past Medical History:  Diagnosis Date   Angio-edema    Anxiety    Eczema    as a child   GERD (gastroesophageal reflux disease)    H. pylori infection    Hematuria    Hepatic steatosis    Urticaria      Past Surgical History:  Procedure Laterality Date   BREAST BIOPSY Right 06/12/2015   benign    CHOLECYSTECTOMY     colonscopy     UPPER GASTROINTESTINAL ENDOSCOPY      Family History  Problem Relation Age of Onset   Breast cancer Mother    Colon cancer Neg Hx    Allergic rhinitis Neg Hx    Asthma Neg Hx    Eczema Neg Hx    Urticaria Neg Hx      Social History   Tobacco Use   Smoking status: Never   Smokeless tobacco: Never  Vaping Use   Vaping Use: Never used  Substance Use Topics   Alcohol use: Yes    Comment: SOCIAL    Drug use: No    ROS   Objective:   Vitals: BP (!) 144/88    Pulse 77    Temp 98.1 F (36.7 C)    Resp 20    SpO2 98%   Physical Exam Constitutional:      General: She is not in acute distress.    Appearance: Normal appearance. She is well-developed. She is not ill-appearing, toxic-appearing or diaphoretic.  HENT:     Head: Normocephalic and atraumatic.     Nose: Nose normal.     Mouth/Throat:     Mouth: Mucous membranes are moist.  Eyes:     General: No scleral icterus.       Right eye: No discharge.        Left eye: No discharge.     Extraocular Movements: Extraocular movements intact.  Cardiovascular:     Rate and Rhythm: Normal  rate.  Pulmonary:     Effort: Pulmonary effort is normal.  Musculoskeletal:       Feet:  Skin:    General: Skin is warm and dry.  Neurological:     General: No focal deficit present.     Mental Status: She is alert and oriented to person, place, and time.  Psychiatric:        Mood and Affect: Mood normal.        Behavior: Behavior normal.    DG Foot Complete Left  Result Date: 04/01/2021 CLINICAL DATA:  Dorsal left foot pain EXAM: LEFT FOOT - COMPLETE 3+ VIEW COMPARISON:  None. FINDINGS: Frontal, oblique, and lateral views of the left foot are obtained. No fracture, subluxation, or dislocation. Joint spaces are well preserved. Soft tissues are unremarkable. IMPRESSION: 1. Unremarkable left foot. Electronically Signed   By: Randa Ngo M.D.   On: 04/01/2021 17:39     Assessment and Plan :   PDMP not reviewed this encounter.  1. Left foot pain    Suspect foot pain that is inflammatory in nature due to overuse.  Recommended rice method, naproxen for pain and inflammation. Counseled patient on potential for adverse effects with medications  prescribed/recommended today, ER and return-to-clinic precautions discussed, patient verbalized understanding.    Jaynee Eagles, PA-C 04/01/21 1755

## 2021-04-01 NOTE — ED Triage Notes (Signed)
Pt presents with left foot pain for past week, pt states she recently began walking about 5 miles a dya, pain is on top of foot and has swelling at night

## 2021-04-05 ENCOUNTER — Other Ambulatory Visit: Payer: Self-pay

## 2021-04-05 ENCOUNTER — Ambulatory Visit
Admission: EM | Admit: 2021-04-05 | Discharge: 2021-04-05 | Disposition: A | Discharge status: Home / Self Care | Payer: 59 | Attending: Urgent Care | Admitting: Urgent Care

## 2021-04-05 ENCOUNTER — Encounter: Payer: Self-pay | Admitting: Emergency Medicine

## 2021-04-05 DIAGNOSIS — S86811A Strain of other muscle(s) and tendon(s) at lower leg level, right leg, initial encounter: Secondary | ICD-10-CM | POA: Diagnosis not present

## 2021-04-05 DIAGNOSIS — R030 Elevated blood-pressure reading, without diagnosis of hypertension: Secondary | ICD-10-CM | POA: Diagnosis not present

## 2021-04-05 MED ORDER — NAPROXEN 500 MG PO TABS: 500.0000 mg | ORAL_TABLET | Freq: Two times a day (BID) | ORAL | 0 refills | Status: AC

## 2021-04-05 NOTE — ED Provider Notes (Signed)
[place]   MRN: [id number] DOB: 07/09/1975  Subjective:   Laura Hansen is a 46 y.o. female presenting for acute onset today of right calf pain, swelling, difficulty bearing weight and moving the foot due to pain at the calf.  Has applied some topical medication, has wrapped using an Ace wrap.  No bruising.  No current facility-administered medications for this encounter.  Current Outpatient Medications:    EPINEPHrine 0.3 mg/0.3 mL IJ SOAJ injection, Inject 0.3 mg into the muscle as needed for anaphylaxis., Disp: , Rfl:    fexofenadine (ALLEGRA) 180 MG tablet, Take 180 mg by mouth daily., Disp: , Rfl:    levofloxacin (LEVAQUIN) 500 MG tablet, Take 1 tablet (500 mg total) by mouth daily., Disp: 5 tablet, Rfl: 0   naproxen (NAPROSYN) 500 MG tablet, Take 1 tablet (500 mg total) by mouth 2 (two) times daily with a meal., Disp: 30 tablet, Rfl: 0   predniSONE (STERAPRED UNI-PAK 21 TAB) 10 MG (21) TBPK tablet, Take by mouth daily. Take 6 tabs by mouth daily  for 2 days, then 5 tabs for 2 days, then 4 tabs for 2 days, then 3 tabs for 2 days, 2 tabs for 2 days, then 1 tab by mouth daily for 2 days, Disp: 42 tablet, Rfl: 0   triamcinolone ointment (KENALOG) 0.5 %, Apply 1 application topically 2 (two) times daily., Disp: 60 g, Rfl: 0   Allergies  Allergen Reactions   Peanut-Containing Drug Products Anaphylaxis    All nuts   Amoxicillin    Codeine     Past Medical History:  Diagnosis Date   Angio-edema    Anxiety    Eczema    as a child   GERD (gastroesophageal reflux disease)    H. pylori infection    Hematuria    Hepatic steatosis    Urticaria      Past Surgical History:  Procedure Laterality Date   BREAST BIOPSY Right 06/12/2015   benign    CHOLECYSTECTOMY     colonscopy     UPPER GASTROINTESTINAL ENDOSCOPY      Family History  Problem Relation Age of Onset   Breast cancer Mother    Colon cancer Neg Hx    Allergic rhinitis Neg Hx    Asthma Neg Hx     Eczema Neg Hx    Urticaria Neg Hx     Social History   Tobacco Use   Smoking status: Never   Smokeless tobacco: Never  Vaping Use   Vaping Use: Never used  Substance Use Topics   Alcohol use: Yes    Comment: SOCIAL    Drug use: No    ROS   Objective:   Vitals: BP (!) 161/98 (BP Location: Right Arm)    Pulse 69    Temp 98.2 F (36.8 C) (Oral)    Resp 18    Ht 5\' 9"  (1.753 m)    Wt 270 lb (122.5 kg)    LMP 03/20/2021 (Approximate)    SpO2 97%    BMI 39.87 kg/m   BP Readings from Last 3 Encounters:  04/05/21 (!) 161/98  04/01/21 (!) 144/88  01/05/20 (!) 147/93   Physical Exam Constitutional:      General: She is not in acute distress.    Appearance: Normal appearance. She is well-developed. She is not ill-appearing, toxic-appearing or diaphoretic.  HENT:     Head: Normocephalic and atraumatic.     Nose: Nose normal.     Mouth/Throat:  Mouth: Mucous membranes are moist.  Eyes:     General: No scleral icterus.       Right eye: No discharge.        Left eye: No discharge.     Extraocular Movements: Extraocular movements intact.  Cardiovascular:     Rate and Rhythm: Normal rate.  Pulmonary:     Effort: Pulmonary effort is normal.  Musculoskeletal:     Left lower leg: Swelling and tenderness present. No deformity, lacerations or bony tenderness. No edema.     Left ankle:     Left Achilles Tendon: No tenderness or defects. Thompson's test negative.  Skin:    General: Skin is warm and dry.  Neurological:     General: No focal deficit present.     Mental Status: She is alert and oriented to person, place, and time.  Psychiatric:        Mood and Affect: Mood normal.        Behavior: Behavior normal.        Thought Content: Thought content normal.        Judgment: Judgment normal.    I applied a 4 inch Ace wrap to the right lower leg starting distally and ending proximally.  Assessment and Plan :   PDMP not reviewed this encounter.  1. Strain of right  calf muscle   2. Elevated blood pressure reading    We will manage with RICE method, ambulate with crutches as necessary for right calf strain.  I have low suspicion for an Achilles tendon rupture given physical exam findings.  Recommended naproxen for pain and inflammation.  We did discuss her elevated blood pressure readings and she will monitor these at home.  Follow-up with PCP if they remain elevated.  Regarding the calf strain, recommended further work-up if symptoms persist with Dr. Aline Brochure. Counseled patient on potential for adverse effects with medications prescribed/recommended today, ER and return-to-clinic precautions discussed, patient verbalized understanding.    Jaynee Eagles, PA-C 04/05/21 1011

## 2021-04-05 NOTE — ED Triage Notes (Addendum)
Pt reports was helping moving a cow this am and reports was wearing muck boot and reports stepped, heard loud pop and felt pain to right calf. Pt reports right calf is wrapped with ace wrap and is unable to bear weight without intense pain. Decreased ROM.

## 2021-04-05 NOTE — Discharge Instructions (Addendum)
Please make sure you use rest, icing, compression, elevation (RICE) method.  It is important that you avoid doing a lot on your right leg and therefore use crutches to move around as necessary.  Icing you can do 20 minutes every 2 hours.  Use the compression sleeve during the 2 hours that you are not using the ice.  And take off the compression sleeve when you are icing.  However, still use a barrier with the ice.  Make sure that you follow-up with Dr. Aline Brochure as you may end up needing physical therapy for your calf strain.  Please keep close eyes on your blood pressure.  And follow-up with your PCP if the pressure remains elevated.

## 2021-04-08 DIAGNOSIS — M79662 Pain in left lower leg: Secondary | ICD-10-CM | POA: Diagnosis not present

## 2021-04-15 DIAGNOSIS — M79662 Pain in left lower leg: Secondary | ICD-10-CM | POA: Diagnosis not present

## 2021-04-15 DIAGNOSIS — M79605 Pain in left leg: Secondary | ICD-10-CM | POA: Diagnosis not present

## 2021-04-15 DIAGNOSIS — M79604 Pain in right leg: Secondary | ICD-10-CM | POA: Diagnosis not present

## 2021-04-15 DIAGNOSIS — M79661 Pain in right lower leg: Secondary | ICD-10-CM | POA: Diagnosis not present

## 2021-04-16 ENCOUNTER — Other Ambulatory Visit (HOSPITAL_COMMUNITY): Payer: Self-pay | Admitting: Family Medicine

## 2021-04-16 ENCOUNTER — Other Ambulatory Visit: Payer: Self-pay | Admitting: Family Medicine

## 2021-04-16 DIAGNOSIS — R609 Edema, unspecified: Secondary | ICD-10-CM

## 2021-04-16 DIAGNOSIS — M79661 Pain in right lower leg: Secondary | ICD-10-CM

## 2021-04-17 ENCOUNTER — Other Ambulatory Visit: Payer: Self-pay

## 2021-04-17 ENCOUNTER — Ambulatory Visit (HOSPITAL_COMMUNITY)
Admission: RE | Admit: 2021-04-17 | Discharge: 2021-04-17 | Disposition: A | Discharge status: Home / Self Care | Payer: 59 | Source: Ambulatory Visit | Attending: Family Medicine | Admitting: Family Medicine

## 2021-04-17 DIAGNOSIS — R609 Edema, unspecified: Secondary | ICD-10-CM | POA: Diagnosis not present

## 2021-04-17 DIAGNOSIS — M79661 Pain in right lower leg: Secondary | ICD-10-CM | POA: Insufficient documentation

## 2021-04-17 DIAGNOSIS — I82402 Acute embolism and thrombosis of unspecified deep veins of left lower extremity: Secondary | ICD-10-CM | POA: Diagnosis not present

## 2021-04-17 DIAGNOSIS — I82441 Acute embolism and thrombosis of right tibial vein: Secondary | ICD-10-CM | POA: Diagnosis not present

## 2021-04-29 DIAGNOSIS — I82401 Acute embolism and thrombosis of unspecified deep veins of right lower extremity: Secondary | ICD-10-CM | POA: Diagnosis not present

## 2021-05-09 DIAGNOSIS — M79661 Pain in right lower leg: Secondary | ICD-10-CM | POA: Diagnosis not present

## 2021-05-14 DIAGNOSIS — M79661 Pain in right lower leg: Secondary | ICD-10-CM | POA: Diagnosis not present

## 2021-05-20 DIAGNOSIS — M79661 Pain in right lower leg: Secondary | ICD-10-CM | POA: Diagnosis not present

## 2021-05-30 DIAGNOSIS — M79661 Pain in right lower leg: Secondary | ICD-10-CM | POA: Diagnosis not present

## 2021-06-06 DIAGNOSIS — M79661 Pain in right lower leg: Secondary | ICD-10-CM | POA: Diagnosis not present

## 2021-06-25 ENCOUNTER — Other Ambulatory Visit: Payer: Self-pay | Admitting: Family Medicine

## 2021-06-25 DIAGNOSIS — M79605 Pain in left leg: Secondary | ICD-10-CM | POA: Diagnosis not present

## 2021-06-25 DIAGNOSIS — M79661 Pain in right lower leg: Secondary | ICD-10-CM | POA: Diagnosis not present

## 2021-06-25 DIAGNOSIS — M79604 Pain in right leg: Secondary | ICD-10-CM | POA: Diagnosis not present

## 2021-06-25 DIAGNOSIS — M79662 Pain in left lower leg: Secondary | ICD-10-CM | POA: Diagnosis not present

## 2021-07-04 ENCOUNTER — Other Ambulatory Visit: Payer: Self-pay | Admitting: Family Medicine

## 2021-07-04 ENCOUNTER — Other Ambulatory Visit (HOSPITAL_COMMUNITY): Payer: Self-pay | Admitting: Family Medicine

## 2021-07-04 ENCOUNTER — Other Ambulatory Visit (HOSPITAL_COMMUNITY): Payer: Self-pay

## 2021-07-04 DIAGNOSIS — M79604 Pain in right leg: Secondary | ICD-10-CM

## 2021-07-08 ENCOUNTER — Ambulatory Visit (HOSPITAL_COMMUNITY)
Admission: RE | Admit: 2021-07-08 | Discharge: 2021-07-08 | Disposition: A | Discharge status: Home / Self Care | Payer: 59 | Source: Ambulatory Visit | Attending: Family | Admitting: Family

## 2021-07-08 DIAGNOSIS — M79604 Pain in right leg: Secondary | ICD-10-CM | POA: Insufficient documentation

## 2021-07-08 DIAGNOSIS — I82541 Chronic embolism and thrombosis of right tibial vein: Secondary | ICD-10-CM | POA: Diagnosis not present

## 2021-07-08 DIAGNOSIS — R600 Localized edema: Secondary | ICD-10-CM | POA: Diagnosis not present

## 2021-07-17 ENCOUNTER — Ambulatory Visit (INDEPENDENT_AMBULATORY_CARE_PROVIDER_SITE_OTHER): Payer: 59 | Admitting: Vascular Surgery

## 2021-07-17 ENCOUNTER — Encounter: Payer: Self-pay | Admitting: Vascular Surgery

## 2021-07-17 VITALS — BP 129/82 | HR 72 | Temp 98.4°F | Resp 18 | Ht 68.0 in | Wt 283.8 lb

## 2021-07-17 DIAGNOSIS — I82561 Chronic embolism and thrombosis of right calf muscular vein: Secondary | ICD-10-CM

## 2021-07-17 NOTE — Progress Notes (Signed)
ASSESSMENT & PLAN   HISTORY OF RIGHT CALF DVT: Her follow-up duplex scan in May showed resolution of clot in one of the paired posterior tibial veins.  She has completed 3 months of Eliquis.  At this point she can stop her Eliquis.  I have recommended that she begin taking 81 mg of aspirin for the next 4 to 6 weeks.  I have encouraged her to elevate her legs daily and we have discussed the proper positioning for this.  I encouraged her to wear her compression stockings when she is sitting at work.  We also discussed the importance of exercise specifically walking and water aerobics.  I do not think any further follow-up is necessary unless she develops new symptoms or swelling.  I will be happy to see her back at any time if any new vascular issues arise.  REASON FOR CONSULT:    Venous disease.  The consult is requested by Dr. Nuala Alpha.   HPI:   Laura Hansen is a 46 y.o. female who is referred with a history of a DVT.  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  She subsequently had a venous duplex scan on 04/17/2021 which showed evidence of an isolated right calf DVT involving the paired posterior tibial veins.  A follow-up study on 07/08/2021 showed clot in only one of the 2 posterior tibial veins.  She has been maintained on Eliquis.  On my history the patient continues to have some mild aching pain in the right calf related to her muscle tear.  Her swelling has improved.  She does sit at work.  She will now have completed 3 months of Eliquis.  She denies any chest pain or shortness of breath.  Past Medical History:  Diagnosis Date   Angio-edema    Anxiety    Eczema    as a child   GERD (gastroesophageal reflux disease)    H. pylori infection    Hematuria    Hepatic steatosis    Urticaria     Family History  Problem Relation Age of Onset   Breast cancer Mother    Colon cancer Neg Hx    Allergic rhinitis Neg Hx    Asthma Neg Hx    Eczema Neg  Hx    Urticaria Neg Hx     SOCIAL HISTORY: Social History   Tobacco Use   Smoking status: Never   Smokeless tobacco: Never  Substance Use Topics   Alcohol use: Yes    Comment: SOCIAL     Allergies  Allergen Reactions   Peanut-Containing Drug Products Anaphylaxis    All nuts   Amoxicillin    Codeine     Current Outpatient Medications  Medication Sig Dispense Refill   apixaban (ELIQUIS) 5 MG TABS tablet Take 5 mg by mouth 2 (two) times daily.     Cholecalciferol (VITAMIN D3) 50 MCG (2000 UT) CAPS Take by mouth daily. 2000 IU daily per patient     fexofenadine (ALLEGRA) 180 MG tablet Take 180 mg by mouth daily.     pantoprazole (PROTONIX) 40 MG tablet Take 40 mg by mouth daily.     EPINEPHrine 0.3 mg/0.3 mL IJ SOAJ injection Inject 0.3 mg into the muscle as needed for anaphylaxis. (Patient not taking: Reported on 07/17/2021)     levofloxacin (LEVAQUIN) 500 MG tablet Take 1 tablet (500 mg total) by mouth daily. (Patient not taking: Reported on 07/17/2021) 5 tablet 0  naproxen (NAPROSYN) 500 MG tablet Take 1 tablet (500 mg total) by mouth 2 (two) times daily with a meal. (Patient not taking: Reported on 07/17/2021) 30 tablet 0   predniSONE (STERAPRED UNI-PAK 21 TAB) 10 MG (21) TBPK tablet Take by mouth daily. Take 6 tabs by mouth daily  for 2 days, then 5 tabs for 2 days, then 4 tabs for 2 days, then 3 tabs for 2 days, 2 tabs for 2 days, then 1 tab by mouth daily for 2 days (Patient not taking: Reported on 07/17/2021) 42 tablet 0   triamcinolone ointment (KENALOG) 0.5 % Apply 1 application topically 2 (two) times daily. (Patient not taking: Reported on 07/17/2021) 60 g 0   No current facility-administered medications for this visit.    REVIEW OF SYSTEMS:  '[X]'$  denotes positive finding, '[ ]'$  denotes negative finding Cardiac  Comments:  Chest pain or chest pressure:    Shortness of breath upon exertion:    Short of breath when lying flat:    Irregular heart rhythm:        Vascular     Pain in calf, thigh, or hip brought on by ambulation: x   Pain in feet at night that wakes you up from your sleep:     Blood clot in your veins:    Leg swelling:  x       Pulmonary    Oxygen at home:    Productive cough:     Wheezing:         Neurologic    Sudden weakness in arms or legs:     Sudden numbness in arms or legs:     Sudden onset of difficulty speaking or slurred speech:    Temporary loss of vision in one eye:     Problems with dizziness:         Gastrointestinal    Blood in stool:     Vomited blood:         Genitourinary    Burning when urinating:     Blood in urine:        Psychiatric    Major depression:         Hematologic    Bleeding problems:    Problems with blood clotting too easily:        Skin    Rashes or ulcers:        Constitutional    Fever or chills:    -  PHYSICAL EXAM:   Vitals:   07/17/21 1003  BP: 129/82  Pulse: 72  Resp: 18  Temp: 98.4 F (36.9 C)  TempSrc: Temporal  SpO2: 99%  Weight: 283 lb 12.8 oz (128.7 kg)  Height: '5\' 8"'$  (1.727 m)   Body mass index is 43.15 kg/m. GENERAL: The patient is a well-nourished female, in no acute distress. The vital signs are documented above. CARDIAC: There is a regular rate and rhythm.  VASCULAR: I do not detect carotid bruits. She has palpable posterior tibial and dorsalis pedis pulses bilaterally. Currently she has no significant lower extremity swelling. PULMONARY: There is good air exchange bilaterally without wheezing or rales. ABDOMEN: Soft and non-tender with normal pitched bowel sounds.  MUSCULOSKELETAL: There are no major deformities. NEUROLOGIC: No focal weakness or paresthesias are detected. SKIN: There are no ulcers or rashes noted. PSYCHIATRIC: The patient has a normal affect.  DATA:    I have reviewed the patient's 2 previous venous duplex scans.  On 04/17/2021 the patient had a right lower extremity  venous duplex scan which showed a calf DVT involving the paired  posterior tibial veins.  There were no other significant findings.  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  There were no other significant findings.  Deitra Mayo [facility]

## 2022-03-25 ENCOUNTER — Other Ambulatory Visit (HOSPITAL_COMMUNITY): Payer: Self-pay | Admitting: Adult Health Nurse Practitioner

## 2022-03-25 DIAGNOSIS — Z1231 Encounter for screening mammogram for malignant neoplasm of breast: Secondary | ICD-10-CM

## 2022-05-12 ENCOUNTER — Ambulatory Visit (HOSPITAL_COMMUNITY)
Admission: RE | Admit: 2022-05-12 | Discharge: 2022-05-12 | Disposition: A | Discharge status: Home / Self Care | Payer: 59 | Source: Ambulatory Visit | Attending: Adult Health Nurse Practitioner | Admitting: Adult Health Nurse Practitioner

## 2022-05-12 DIAGNOSIS — Z1231 Encounter for screening mammogram for malignant neoplasm of breast: Secondary | ICD-10-CM | POA: Diagnosis not present

## 2023-02-06 ENCOUNTER — Ambulatory Visit
Admission: EM | Admit: 2023-02-06 | Discharge: 2023-02-06 | Disposition: A | Discharge status: Home / Self Care | Payer: BLUE CROSS/BLUE SHIELD | Attending: Nurse Practitioner | Admitting: Nurse Practitioner

## 2023-02-06 DIAGNOSIS — J22 Unspecified acute lower respiratory infection: Secondary | ICD-10-CM

## 2023-02-06 MED ORDER — PREDNISONE 20 MG PO TABS: 40.0000 mg | ORAL_TABLET | Freq: Every day | ORAL | 0 refills | Status: AC

## 2023-02-06 MED ORDER — PROMETHAZINE-DM 6.25-15 MG/5ML PO SYRP: 5.0000 mL | ORAL_SOLUTION | Freq: Four times a day (QID) | ORAL | 0 refills | Status: AC | PRN

## 2023-02-06 MED ORDER — ALBUTEROL SULFATE HFA 108 (90 BASE) MCG/ACT IN AERS: 2.0000 | INHALATION_SPRAY | Freq: Four times a day (QID) | RESPIRATORY_TRACT | 0 refills | Status: AC | PRN

## 2023-02-06 MED ORDER — DOXYCYCLINE HYCLATE 100 MG PO CAPS: 100.0000 mg | ORAL_CAPSULE | Freq: Two times a day (BID) | ORAL | 0 refills | Status: AC

## 2023-02-06 NOTE — Discharge Instructions (Addendum)
Take medication as prescribed. Increase fluids and allow for plenty of rest. Recommend over-the-counter Tylenol or ibuprofen as needed for pain, fever, or general discomfort. Continue use of your cool-mist humidifier as needed. Go to the emergency department immediately if you experience worsening shortness of breath, difficulty breathing, or other concerns. Follow-up as needed.

## 2023-02-06 NOTE — ED Triage Notes (Signed)
Pt reports she has a headache, bad cough, and chest congestion x 3 weeks

## 2023-02-06 NOTE — ED Provider Notes (Signed)
[facility]    CSN: [id number] Arrival date & time: 02/06/23  1529      History   Chief Complaint No chief complaint on file.   HPI Laura Hansen is a 47 y.o. female.   The history is provided by the patient.   Patient presents with a 3-week history of fever, cough, chest congestion, and headache.  Patient states earlier while she was at work today, she did have a fever of 102.4.  Patient states that her chest feels tight and that she cannot get anything up.  She states that she does feel like she has also been wheezing.  She denies ear pain, ear drainage, abdominal pain, nausea, vomiting, diarrhea, or rash.  Patient reports she has been taking several over-the-counter cough and cold medications with minimal relief.  Past Medical History:  Diagnosis Date   Angio-edema    Anxiety    Eczema    as a child   GERD (gastroesophageal reflux disease)    H. pylori infection    Hematuria    Hepatic steatosis    Urticaria     Patient Active Problem List   Diagnosis Date Noted   Urticaria 03/10/2018   Adverse food reaction 03/10/2018   Other allergic rhinitis 03/10/2018   RUQ abdominal pain 07/25/2015   Obesity (BMI 30-39.9) 07/12/2015   GAD (generalized anxiety disorder) 07/12/2015   Allergic reaction 12/18/2014   GERD 10/10/2009    Past Surgical History:  Procedure Laterality Date   BREAST BIOPSY Right 06/12/2015   benign    CHOLECYSTECTOMY     colonscopy     UPPER GASTROINTESTINAL ENDOSCOPY      OB History   No obstetric history on file.      Home Medications    Prior to Admission medications   Medication Sig Start Date End Date Taking? Authorizing Provider  albuterol (VENTOLIN HFA) 108 (90 Base) MCG/ACT inhaler Inhale 2 puffs into the lungs every 6 (six) hours as needed for wheezing or shortness of breath. 02/06/23  Yes Leath-Warren, Sadie Haber, NP  doxycycline (VIBRAMYCIN) 100 MG capsule Take 1 capsule (100 mg total) by mouth 2 (two) times  daily for 7 days. 02/06/23 02/13/23 Yes Leath-Warren, Sadie Haber, NP  predniSONE (DELTASONE) 20 MG tablet Take 2 tablets (40 mg total) by mouth daily with breakfast for 5 days. 02/06/23 02/11/23 Yes Leath-Warren, Sadie Haber, NP  promethazine-dextromethorphan (PROMETHAZINE-DM) 6.25-15 MG/5ML syrup Take 5 mLs by mouth 4 (four) times daily as needed. 02/06/23  Yes Leath-Warren, Sadie Haber, NP  apixaban (ELIQUIS) 5 MG TABS tablet Take 5 mg by mouth 2 (two) times daily.    [provider]  Cholecalciferol (VITAMIN D3) 50 MCG (2000 UT) CAPS Take by mouth daily. 2000 IU daily per patient    [provider]  EPINEPHrine 0.3 mg/0.3 mL IJ SOAJ injection Inject 0.3 mg into the muscle as needed for anaphylaxis. Patient not taking: Reported on 07/17/2021    [provider]  fexofenadine (ALLEGRA) 180 MG tablet Take 180 mg by mouth daily.    [provider]  levofloxacin (LEVAQUIN) 500 MG tablet Take 1 tablet (500 mg total) by mouth daily. Patient not taking: Reported on 07/17/2021 01/05/20   Wurst, [place], PA-C  naproxen (NAPROSYN) 500 MG tablet Take 1 tablet (500 mg total) by mouth 2 (two) times daily with a meal. Patient not taking: Reported on 07/17/2021 04/05/21   Wallis Bamberg, PA-C  pantoprazole (PROTONIX) 40 MG tablet Take 40 mg by mouth daily.  [provider]  triamcinolone ointment (KENALOG) 0.5 % Apply 1 application topically 2 (two) times daily. Patient not taking: Reported on 07/17/2021 02/03/18   Junie Spencer, FNP    Family History Family History  Problem Relation Age of Onset   Breast cancer Mother    Colon cancer Neg Hx    Allergic rhinitis Neg Hx    Asthma Neg Hx    Eczema Neg Hx    Urticaria Neg Hx     Social History Social History   Tobacco Use   Smoking status: Never   Smokeless tobacco: Never  Vaping Use   Vaping status: Never Used  Substance Use Topics   Alcohol use: Yes    Comment: SOCIAL    Drug use: No     Allergies    Peanut-containing drug products, Amoxicillin, and Codeine   Review of Systems Review of Systems Per HPI  Physical Exam Triage Vital Signs ED Triage Vitals  Encounter Vitals Group     BP 02/06/23 1617 (!) 152/92     Systolic BP Percentile --      Diastolic BP Percentile --      Pulse Rate 02/06/23 1617 93     Resp 02/06/23 1617 18     Temp 02/06/23 1617 99.9 F (37.7 C)     Temp Source 02/06/23 1617 Oral     SpO2 02/06/23 1617 97 %     Weight --      Height --      Head Circumference --      Peak Flow --      Pain Score 02/06/23 1618 5     Pain Loc --      Pain Education --      Exclude from Growth Chart --    No data found.  Updated Vital Signs BP (!) 152/92 (BP Location: Right Arm)   Pulse 93   Temp 99.9 F (37.7 C) (Oral)   Resp 18   LMP 12/29/2022 (Approximate)   SpO2 97%   Visual Acuity Right Eye Distance:   Left Eye Distance:   Bilateral Distance:    Right Eye Near:   Left Eye Near:    Bilateral Near:     Physical Exam Vitals and nursing note reviewed.  Constitutional:      General: She is not in acute distress.    Appearance: Normal appearance.  HENT:     Head: Normocephalic.     Right Ear: Tympanic membrane, ear canal and external ear normal.     Left Ear: Tympanic membrane, ear canal and external ear normal.     Nose: Nose normal.     Mouth/Throat:     Mouth: Mucous membranes are moist.     Comments: Cobblestoning present to posterior oropharynx  Eyes:     Extraocular Movements: Extraocular movements intact.     Conjunctiva/sclera: Conjunctivae normal.     Pupils: Pupils are equal, round, and reactive to light.  Cardiovascular:     Rate and Rhythm: Normal rate and regular rhythm.     Pulses: Normal pulses.     Heart sounds: Normal heart sounds.  Pulmonary:     Effort: Pulmonary effort is normal. No respiratory distress.     Breath sounds: Normal breath sounds. No stridor. No wheezing, rhonchi or rales.  Abdominal:     General: Bowel  sounds are normal.     Palpations: Abdomen is soft.     Tenderness: There is no abdominal tenderness.  Musculoskeletal:  Cervical back: Normal range of motion.  Lymphadenopathy:     Cervical: No cervical adenopathy.  Skin:    General: Skin is warm and dry.  Neurological:     General: No focal deficit present.     Mental Status: She is alert and oriented to person, place, and time.  Psychiatric:        Mood and Affect: Mood normal.        Behavior: Behavior normal.      UC Treatments / Results  Labs (all labs ordered are listed, but only abnormal results are displayed) Labs Reviewed - No data to display  EKG   Radiology No results found.  Procedures Procedures (including critical care time)  Medications Ordered in UC Medications - No data to display  Initial Impression / Assessment and Plan / UC Course  I have reviewed the triage vital signs and the nursing notes.  Pertinent labs & imaging results that were available during my care of the patient were reviewed by me and considered in my medical decision making (see chart for details).  On exam, lung sounds are clear throughout, room air sats at 97%.  Given the duration of the cough and the persistence, cannot rule out for pneumonia or lower respiratory infection..  Will treat empirically with doxycycline 100 mg twice daily for the next 7 days, and albuterol inhaler as needed for shortness of breath, prednisone 40 mg for bronchial inflammation, and Promethazine DM for cough at nighttime.  Supportive care recommendations were provided and discussed with the patient to include over-the-counter analgesics, fluids, rest, and continuing to use the humidifier at nighttime during sleep and sleeping elevated.  Patient was given strict ER follow-up precautions, along with indications to follow-up in this clinic.  Patient was in agreement with this plan of care and verbalizes understanding.  All questions were answered.  Patient  stable for discharge.  Final Clinical Impressions(s) / UC Diagnoses   Final diagnoses:  Lower respiratory infection     Discharge Instructions      Take medication as prescribed. Increase fluids and allow for plenty of rest. Recommend over-the-counter Tylenol or ibuprofen as needed for pain, fever, or general discomfort. Continue use of your cool-mist humidifier as needed. Go to the emergency department immediately if you experience worsening shortness of breath, difficulty breathing, or other concerns. Follow-up as needed.     ED Prescriptions     Medication Sig Dispense Auth. Provider   doxycycline (VIBRAMYCIN) 100 MG capsule Take 1 capsule (100 mg total) by mouth 2 (two) times daily for 7 days. 14 capsule Leath-Warren, Sadie Haber, NP   predniSONE (DELTASONE) 20 MG tablet Take 2 tablets (40 mg total) by mouth daily with breakfast for 5 days. 10 tablet Leath-Warren, Sadie Haber, NP   albuterol (VENTOLIN HFA) 108 (90 Base) MCG/ACT inhaler Inhale 2 puffs into the lungs every 6 (six) hours as needed for wheezing or shortness of breath. 8 g Leath-Warren, Sadie Haber, NP   promethazine-dextromethorphan (PROMETHAZINE-DM) 6.25-15 MG/5ML syrup Take 5 mLs by mouth 4 (four) times daily as needed. 118 mL Leath-Warren, Sadie Haber, NP      PDMP not reviewed this encounter.   Abran Cantor, NP 02/06/23 1733
